# Patient Record
Sex: Female | Born: 1980 | Race: White | Hispanic: No | Marital: Married | Smoking: Never smoker
Health system: Southern US, Community
[De-identification: ages and names within clinical notes are randomized; demographics above are authoritative.]

## PROBLEM LIST (undated history)

## (undated) DIAGNOSIS — L709 Acne, unspecified: Secondary | ICD-10-CM

## (undated) DIAGNOSIS — R112 Nausea with vomiting, unspecified: Secondary | ICD-10-CM

## (undated) DIAGNOSIS — R12 Heartburn: Secondary | ICD-10-CM

## (undated) DIAGNOSIS — F32A Depression, unspecified: Secondary | ICD-10-CM

## (undated) DIAGNOSIS — G4730 Sleep apnea, unspecified: Secondary | ICD-10-CM

## (undated) DIAGNOSIS — I10 Essential (primary) hypertension: Secondary | ICD-10-CM

## (undated) DIAGNOSIS — Z8489 Family history of other specified conditions: Secondary | ICD-10-CM

## (undated) DIAGNOSIS — IMO0001 Reserved for inherently not codable concepts without codable children: Secondary | ICD-10-CM

## (undated) DIAGNOSIS — Z973 Presence of spectacles and contact lenses: Secondary | ICD-10-CM

## (undated) DIAGNOSIS — F419 Anxiety disorder, unspecified: Secondary | ICD-10-CM

## (undated) DIAGNOSIS — L309 Dermatitis, unspecified: Secondary | ICD-10-CM

## (undated) DIAGNOSIS — F329 Major depressive disorder, single episode, unspecified: Secondary | ICD-10-CM

## (undated) DIAGNOSIS — G43909 Migraine, unspecified, not intractable, without status migrainosus: Secondary | ICD-10-CM

## (undated) DIAGNOSIS — J069 Acute upper respiratory infection, unspecified: Secondary | ICD-10-CM

## (undated) DIAGNOSIS — K644 Residual hemorrhoidal skin tags: Secondary | ICD-10-CM

## (undated) DIAGNOSIS — K219 Gastro-esophageal reflux disease without esophagitis: Secondary | ICD-10-CM

## (undated) DIAGNOSIS — M1990 Unspecified osteoarthritis, unspecified site: Secondary | ICD-10-CM

## (undated) DIAGNOSIS — Z9889 Other specified postprocedural states: Secondary | ICD-10-CM

## (undated) HISTORY — DX: Major depressive disorder, single episode, unspecified: F32.9

## (undated) HISTORY — DX: Migraine, unspecified, not intractable, without status migrainosus: G43.909

## (undated) HISTORY — DX: Heartburn: R12

## (undated) HISTORY — DX: Acute upper respiratory infection, unspecified: J06.9

## (undated) HISTORY — DX: Depression, unspecified: F32.A

## (undated) HISTORY — PX: TONSILLECTOMY: SUR1361

## (undated) HISTORY — PX: UPPER GASTROINTESTINAL ENDOSCOPY: SHX188

## (undated) HISTORY — DX: Reserved for inherently not codable concepts without codable children: IMO0001

## (undated) HISTORY — DX: Dermatitis, unspecified: L30.9

## (undated) HISTORY — DX: Anxiety disorder, unspecified: F41.9

## (undated) HISTORY — DX: Gastro-esophageal reflux disease without esophagitis: K21.9

---

## 2001-06-18 ENCOUNTER — Other Ambulatory Visit: Admission: RE | Admit: 2001-06-18 | Discharge: 2001-06-18 | Payer: Self-pay | Admitting: Obstetrics and Gynecology

## 2006-09-10 ENCOUNTER — Other Ambulatory Visit: Admission: RE | Admit: 2006-09-10 | Discharge: 2006-09-10 | Payer: Self-pay | Admitting: Obstetrics and Gynecology

## 2007-09-03 ENCOUNTER — Ambulatory Visit (HOSPITAL_COMMUNITY): Admission: RE | Admit: 2007-09-03 | Discharge: 2007-09-03 | Payer: Self-pay | Admitting: Obstetrics and Gynecology

## 2008-12-24 HISTORY — PX: TONSILLECTOMY: SUR1361

## 2009-01-24 HISTORY — PX: WISDOM TOOTH EXTRACTION: SHX21

## 2009-11-18 ENCOUNTER — Ambulatory Visit (HOSPITAL_COMMUNITY): Admission: RE | Admit: 2009-11-18 | Discharge: 2009-11-18 | Payer: Self-pay | Admitting: Internal Medicine

## 2009-12-05 ENCOUNTER — Ambulatory Visit (HOSPITAL_COMMUNITY): Admission: RE | Admit: 2009-12-05 | Discharge: 2009-12-05 | Payer: Self-pay | Admitting: Family Medicine

## 2011-01-15 ENCOUNTER — Encounter: Payer: Self-pay | Admitting: Obstetrics and Gynecology

## 2011-01-29 ENCOUNTER — Encounter: Payer: Self-pay | Admitting: Orthopedic Surgery

## 2011-02-11 ENCOUNTER — Encounter: Payer: Self-pay | Admitting: Orthopedic Surgery

## 2011-02-13 ENCOUNTER — Encounter: Payer: Self-pay | Admitting: Orthopedic Surgery

## 2011-02-13 ENCOUNTER — Ambulatory Visit (INDEPENDENT_AMBULATORY_CARE_PROVIDER_SITE_OTHER): Payer: BC Managed Care – PPO | Admitting: Orthopedic Surgery

## 2011-02-13 DIAGNOSIS — M654 Radial styloid tenosynovitis [de Quervain]: Secondary | ICD-10-CM

## 2011-02-20 NOTE — Assessment & Plan Note (Signed)
Summary: rt wrist pain/bringing [name] report/needs xrays/[employer]/frs   Vital Signs:  Patient profile:   30 year old female Height:      64 inches Weight:      163 pounds Pulse rate:   80 / minute Resp:     18 per minute  Visit Type:  NEW PATIENT Referring Provider:  SELF Primary Provider:  [name]  CC:  right wrist.  History of Present Illness: I saw Jaime Hughes in the office today for an initial visit.  She is a 30 years old woman with the complaint of:  RIGHT WRIST PAIN.  NO INJURY.  XRAYS TODAY.  MEDS: INDERAL, PROTONIX, ZOLOFT, NUVARING, CIPRO 500MG  two times a day FOR INFECTED LYMPH NODE IN NECK RT SIDE.  [name] FROM 01/29/11 FOR REVIEW.  30 year old female, right-hand-dominant presents with 2 months of pain in her RIGHT wrist associated with aching and popping.  She describes throbbing, burning, pain with swelling, some numbness and tingling. Her pain is rated 7/10. He is to come and go associated with activity. Patient is an Environmental health practitioner.  Previous treatments include Celebrex, x3 weeks. No relief.  Patient had nerve conduction study, which was normal.  Patient has a year history of bilateral numbness in her hands and her arms, but no neck injury. She does complain of some catching in her cervical spine. Apparently, has not been worked up for that.    Allergies (verified): 1)  ! Codeine 2)  ! Sulfa  Past History:  Past Medical History: REFLUX HEARTBURN MIGRAINES DEPRESSION ANXIETY  Past Surgical History: TONSILS  Family History: FH of Cancer:  Family History of Diabetes Family History of Arthritis  Social History: Patient is single.  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 Cardiovascular:  Denies chest pain, palpitations, fainting, and murmurs. Respiratory:   Denies short of breath, wheezing, couch, tightness, pain on inspiration, and snoring . Gastrointestinal:  Complains of heartburn; denies nausea, vomiting, diarrhea, constipation, and blood in your stools. Genitourinary:  Denies frequency, urgency, difficulty urinating, painful urination, flank pain, and bleeding in urine. Neurologic:  Complains of numbness and tingling; denies unsteady gait, dizziness, tremors, and seizure. Musculoskeletal:  Complains of joint pain, swelling, stiffness, and muscle pain; denies instability, redness, and heat. Endocrine:  Denies excessive thirst, exessive urination, and heat or cold intolerance. Psychiatric:  Denies nervousness, depression, anxiety, and hallucinations. Skin:  Denies changes in the skin, poor healing, rash, itching, and redness. HEENT:  Denies blurred or double vision, eye pain, redness, and watering. Immunology:  Complains of seasonal allergies; denies sinus problems and allergic to bee stings. Hemoatologic:  Denies easy bleeding and brusing.  Physical Exam  Additional Exam:  GEN: well developed, well nourished, normal grooming and hygiene, no deformity and normal body habitus.   CDV: pulses are normal, no edema, no erythema. no tenderness  Lymph: normal lymph nodes   Skin: no rashes, skin lesions or open sores   NEURO: normal coordination, reflexes, sensation.   Psyche: awake, alert and oriented. Mood normal   Gait:   Normal.  RIGHT and LEFT upper extremity examination.  Inspection no swelling or joint deformities. There is some slight weakness to grip strength on the RIGHT versus the LEFT. Range of motion otherwise, normal wrist and hand.  Wrist joint is stable.LEFT and RIGHT  Tenderness is noted over the RIGHT 1st extensor compartment and  the radial styloid. 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 This is out to length radial inclination. is normal and the lateral view shows normal tilt of the articular surface  Impression normal wrist  Orders: New Patient Level III (75643) Wrist x-ray complete, minimum 3 views (32951) patient education, [facility] HANDOUT   Patient Instructions: 1)  ASPERCREME APPLY TO THE THUMB three times a day 2)  APPLY ICE 30 MIN IN THE EVENING  3)  WEAR SPLINT X 6 WEEKS  4)  COME BACK IN 6 WEEKS FOR RE EXAMINATION  5)  YOU HAVE DEQUERVAINS SYNDROME / TENDONITIS OF THE THUMB    Orders Added: 1)  New Patient Level III [88416] 2)  Wrist x-ray complete, minimum 3 views [73110]

## 2011-03-06 ENCOUNTER — Encounter: Payer: Self-pay | Admitting: Orthopedic Surgery

## 2011-03-13 ENCOUNTER — Telehealth: Payer: Self-pay | Admitting: Orthopedic Surgery

## 2011-03-13 NOTE — Telephone Encounter (Signed)
Jaime Hughes says she is wearing the brace, using Aspercream, and icing and her wrist is hurting worse and sometimes locks. Her follow-up is not until 04/03/11. She cannot take Advil due to stomach problems.  Is there anything you can prescribe or  Anything  You can suggest she do.

## 2011-03-13 NOTE — Telephone Encounter (Signed)
Tylenol 1000 mg every 8 hours

## 2011-03-22 NOTE — Letter (Signed)
Summary: History form  History form   Imported By: Cammie Sickle 03/12/2011 20:18:07  _____________________________________________________________________  External Attachment:    Type:   Image     Comment:   External Document

## 2011-03-28 ENCOUNTER — Ambulatory Visit: Payer: BC Managed Care – PPO | Admitting: Orthopedic Surgery

## 2011-04-03 ENCOUNTER — Ambulatory Visit (INDEPENDENT_AMBULATORY_CARE_PROVIDER_SITE_OTHER): Payer: BC Managed Care – PPO | Admitting: Orthopedic Surgery

## 2011-04-03 DIAGNOSIS — M654 Radial styloid tenosynovitis [de Quervain]: Secondary | ICD-10-CM

## 2011-04-03 NOTE — Progress Notes (Signed)
30 years old treated for de Quervain's syndrome with bracing, topical Aspercreme and ice. She did not get better with the nonoperative treatment, but did get an injection from her primary care physician and, notes she's 85% better.  Her Inderal has been stopped. She is now on Topamax and she switched from photonics to Nexium.  Exam shows minimal tenderness and minimal symptoms with de Quervain's stress test.  Impression resolving de Quervain's syndrome.  Continued nonoperative treatment, call us if symptoms worsen, to get an injection.

## 2011-04-03 NOTE — Patient Instructions (Addendum)
Start bio-freeze 3 x a day   Continue ice and bracing   If you remain 85% better then no appointment needed   If not then call us for injection if needed

## 2011-09-17 ENCOUNTER — Other Ambulatory Visit (HOSPITAL_COMMUNITY): Payer: Self-pay | Admitting: Family Medicine

## 2011-09-17 ENCOUNTER — Ambulatory Visit (HOSPITAL_COMMUNITY)
Admission: RE | Admit: 2011-09-17 | Discharge: 2011-09-17 | Disposition: A | Discharge status: Home / Self Care | Payer: BC Managed Care – PPO | Source: Ambulatory Visit | Attending: Family Medicine | Admitting: Family Medicine

## 2011-09-17 DIAGNOSIS — R05 Cough: Secondary | ICD-10-CM

## 2011-09-17 DIAGNOSIS — R059 Cough, unspecified: Secondary | ICD-10-CM | POA: Insufficient documentation

## 2011-11-27 ENCOUNTER — Encounter (INDEPENDENT_AMBULATORY_CARE_PROVIDER_SITE_OTHER): Payer: Self-pay | Admitting: Internal Medicine

## 2011-11-27 ENCOUNTER — Ambulatory Visit (INDEPENDENT_AMBULATORY_CARE_PROVIDER_SITE_OTHER): Payer: BC Managed Care – PPO | Admitting: Internal Medicine

## 2011-11-27 VITALS — BP 118/70 | HR 78 | Temp 99.4°F | Resp 12 | Ht 62.0 in | Wt 134.0 lb

## 2011-11-27 DIAGNOSIS — Z8669 Personal history of other diseases of the nervous system and sense organs: Secondary | ICD-10-CM | POA: Insufficient documentation

## 2011-11-27 DIAGNOSIS — K219 Gastro-esophageal reflux disease without esophagitis: Secondary | ICD-10-CM

## 2011-11-27 MED ORDER — OMEPRAZOLE 40 MG PO CPDR: 40.0000 mg | DELAYED_RELEASE_CAPSULE | Freq: Every day | ORAL | Status: DC

## 2011-11-27 NOTE — Patient Instructions (Signed)
Continue omeprazole at 40 mg twice daily for another 3-4 months and then try it once a day; if you start having relapse of breakthrough symptoms can go back to twice a day schedule.

## 2011-12-03 NOTE — Progress Notes (Signed)
Presenting complaint; Followup for chronic GERD. Subjective: Jaime Hughes is 30 year old Caucasian female who was had symptoms of GERD for about 9 years. She had normal EGD in November 2010. She has been doing well with anti-reflex measures and Nexium orally milligrams a day until about 3 months ago when she developed pain in her right ear on swallowing. Dr. Sudie Bailey referred her to ENT specialist. She underwent nasal endoscopy and was felt her symptoms were due to GERD and Nexium dose was increased to twice a day. It works for her co-pay was very high and therefore she was switched to omeprazole twice daily and she is still doing fine. She states he generally sleeps on her right side. She has not experienced any pain on the left side. She has occasional heartburn and burning in her epigastric region. She has lost 30 pounds in the last 7 months felt to be due to Topamax. She denies melena rectal bleeding diarrhea or constipation. In addition to EGD she also had upper abdominal ultrasound in 2010 and it was normal. Current Medications: Current Outpatient Prescriptions  Medication Sig Dispense Refill  . Norethin Ace-Eth Estrad-FE (GILDESS FE 1.5/30 PO) Take by mouth daily.        Marland Kitchen omeprazole (PRILOSEC) 40 MG capsule Take 40 mg by mouth 2 (two) times daily.        . sertraline (ZOLOFT) 100 MG tablet Take 100 mg by mouth daily.        . SUMAtriptan (IMITREX) 25 MG tablet 25 mg. As needed       . topiramate (TOPAMAX) 100 MG tablet Take 100 mg by mouth 2 (two) times daily.        . valACYclovir (VALTREX) 500 MG tablet 500 mg as needed.       Marland Kitchen omeprazole (PRILOSEC) 40 MG capsule Take 1 capsule (40 mg total) by mouth daily.  180 capsule  3   past medical history; Chronic GERD as above. History of migraine and she is doing better with current therapy. Stress disorder/depression. de Quervain,s syndrome involving the right wrist under care of Dr. [name]. Allergies;  codeine and sulfa. Family  history; Father is 74 has CAD DM and hypertension. Mother mother is 52. At 55 she was treated for breast carcinoma and remains in remission. Patient does not have any siblings. Social history; She is single. 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 She has never smoked cigarettes and drinks alcohol occasionally.  Objective: BP 118/70  Pulse 78  Temp(Src) 99.4 F (37.4 C) (Oral)  Resp 12  Ht 5\' 2"  (1.575 m)  Wt 134 lb (60.782 kg)  BMI 24.51 kg/m2  LMP 11/21/2011  Conjunctiva is pink. Sclera is nonicteric Oral pharyngeal mucosa is normal. No neck masses or thyromegaly noted. Cardiac exam with regular rhythm normal S1 and S2. No murmur or gallop noted. Lungs are clear to auscultation. Abdomen is soft and nontender without organomegaly or masses. No LE edema or clubbing noted.  Assessment: Chronic GERD with recent flareup with pharyngeal and aural symptoms. She is doing well with double dose PPI. She is currently on 40 mg of omeprazole twice daily. EGD about 2 years ago was within normal limits.   Plan: Anti-reflux measures reinforced. Consider decreasing omeprazole dose to 40 mg once a day after 3-4 months and if symptoms relapse give [place] a call otherwise office visit in one year.

## 2012-10-07 ENCOUNTER — Other Ambulatory Visit (INDEPENDENT_AMBULATORY_CARE_PROVIDER_SITE_OTHER): Payer: Self-pay | Admitting: Internal Medicine

## 2012-10-07 NOTE — Telephone Encounter (Signed)
Patient will need ov in 3 months.

## 2012-10-08 NOTE — Telephone Encounter (Signed)
Apt has been scheduled for 12/08/12 at 3:30 pm with Dr. Karilyn Cota.

## 2012-10-14 ENCOUNTER — Encounter (INDEPENDENT_AMBULATORY_CARE_PROVIDER_SITE_OTHER): Payer: Self-pay | Admitting: Internal Medicine

## 2012-10-14 ENCOUNTER — Ambulatory Visit (INDEPENDENT_AMBULATORY_CARE_PROVIDER_SITE_OTHER): Payer: BC Managed Care – PPO | Admitting: Internal Medicine

## 2012-10-14 VITALS — BP 108/70 | HR 74 | Temp 98.0°F | Resp 16 | Ht 62.0 in | Wt 140.3 lb

## 2012-10-14 DIAGNOSIS — K219 Gastro-esophageal reflux disease without esophagitis: Secondary | ICD-10-CM

## 2012-10-14 DIAGNOSIS — R1011 Right upper quadrant pain: Secondary | ICD-10-CM | POA: Insufficient documentation

## 2012-10-14 MED ORDER — OMEPRAZOLE-SODIUM BICARBONATE 40-1100 MG PO CAPS: 1.0000 | ORAL_CAPSULE | Freq: Every day | ORAL | Status: DC

## 2012-10-14 NOTE — Patient Instructions (Addendum)
Physician will contact you with results of HIDA scan when completed. Discontinue omeprazole. Start omeprazole/sodium bicarbonate by mouth before breakfast and bedtime daily.

## 2012-10-14 NOTE — Progress Notes (Signed)
Presenting complaint;  Follow for chronic GERD. Right upper quadrant abdominal pain. Subjective:  Patient is 31 year old Caucasian female who has chronic GERD and was last seen in December 2012. She now presents with 2 complaints. She stays omeprazole is not working. She is having heartburn and regurgitation and she wakes with rawness in her chest and back taste. She got best relief with Nexium her co-pay was too high. She also has tried protonix but doesn't remember if it works well. She denies dysphagia hoarseness or chronic cough. She is watching her diet closely. She also complains of right upper quadrant pain radiating to was her back and shoulder and described to be sharp pain associated with bloating. She has had this pain off and on for the last 6 weeks. This pain is worse after meals. Her bowels generally move daily she may have occasional constipation. She has gained 6 pounds since her last visit. She also reports having 3 episodes of vomiting and diarrhea all operating on Mondays and she recalls she had late dinner each time. She does not take OTC NSAIDs. Current Medications: Current Outpatient Prescriptions  Medication Sig Dispense Refill  . Norethin Ace-Eth Estrad-FE (GILDESS FE 1.5/30 PO) Take by mouth daily.        Marland Kitchen omeprazole (PRILOSEC) 40 MG capsule Take 40 mg by mouth 2 (two) times daily.        . sertraline (ZOLOFT) 100 MG tablet Take 100 mg by mouth daily.        . SUMAtriptan (IMITREX) 25 MG tablet 25 mg. As needed       . topiramate (TOPAMAX) 100 MG tablet Take 100 mg by mouth 2 (two) times daily.        . valACYclovir (VALTREX) 500 MG tablet 500 mg as needed.       Marland Kitchen DISCONTD: omeprazole (PRILOSEC) 40 MG capsule TAKE 1 CAPSULE TWICE DAILY  180 capsule  0  . DISCONTD: omeprazole (PRILOSEC) 40 MG capsule Take 1 capsule (40 mg total) by mouth daily.  180 capsule  3     Objective: Blood pressure 108/70, pulse 74, temperature 98 F (36.7 C), temperature source Oral,  resp. rate 16, height 5\' 2"  (1.575 m), weight 140 lb 4.8 oz (63.64 kg), last menstrual period 09/17/2012.  patient is alert and in no acute history Conjunctiva is pink. Sclera is nonicteric Oropharyngeal mucosa is normal. No neck masses or thyromegaly noted. Cardiac exam with regular rhythm normal S1 and S2. No murmur or gallop noted. Lungs are clear to auscultation. Abdomen is symmetrical. Bowel sounds are normal. Abdomen is soft with mild tenderness below the right costal margin. No hepatosplenomegaly or masses. No LE edema or clubbing noted.  Labs/studies Results:  She had normal ultrasound in November 2010.  Assessment:  #1. Refractory GERD. She is not responding to 80 mg of omeprazole daily. It is possible that she has been retracted disease resulting in poor control of her symptoms. #2. Right upper quadrant abdominal pain with sporadic vomiting. Need to rule out biliary tract disease. Ultrasound 3 years ago was negative for cholelithiasis.   Plan:  Discontinue omeprazole. Zegrid 40 mg by mouth twice a day. HIDA scan with [facility]. Office visit in 6 months.

## 2012-10-20 ENCOUNTER — Encounter (HOSPITAL_COMMUNITY): Payer: Self-pay

## 2012-10-20 ENCOUNTER — Encounter (HOSPITAL_COMMUNITY)
Admission: RE | Admit: 2012-10-20 | Discharge: 2012-10-20 | Disposition: A | Discharge status: Home / Self Care | Payer: BC Managed Care – PPO | Source: Ambulatory Visit | Attending: Internal Medicine | Admitting: Internal Medicine

## 2012-10-20 DIAGNOSIS — R1011 Right upper quadrant pain: Secondary | ICD-10-CM | POA: Insufficient documentation

## 2012-10-20 MED ORDER — TECHNETIUM TC 99M MEBROFENIN IV KIT
5.0000 | PACK | Freq: Once | INTRAVENOUS | Status: AC | PRN
  Administered 2012-10-20: 4.9 via INTRAVENOUS

## 2012-12-08 ENCOUNTER — Ambulatory Visit (INDEPENDENT_AMBULATORY_CARE_PROVIDER_SITE_OTHER): Payer: BC Managed Care – PPO | Admitting: Internal Medicine

## 2013-03-13 ENCOUNTER — Telehealth (INDEPENDENT_AMBULATORY_CARE_PROVIDER_SITE_OTHER): Payer: Self-pay | Admitting: *Deleted

## 2013-03-13 NOTE — Telephone Encounter (Signed)
Jaime Hughes called and said her Omeprazole-Sodium Bicarbonate was going to cost her $600. The deductible has started back over for her medications. She was like to see if there is something cheaper and as strong to help. She can be reached at [phone].

## 2013-03-13 NOTE — Telephone Encounter (Signed)
Patient called and made aware that she may pick up samples ,until Dr.Rehman could review . She will pick up on Monday

## 2013-03-26 ENCOUNTER — Encounter (INDEPENDENT_AMBULATORY_CARE_PROVIDER_SITE_OTHER): Payer: Self-pay | Admitting: *Deleted

## 2013-04-01 ENCOUNTER — Telehealth (INDEPENDENT_AMBULATORY_CARE_PROVIDER_SITE_OTHER): Payer: Self-pay | Admitting: *Deleted

## 2013-04-01 NOTE — Telephone Encounter (Signed)
Jaime Hughes has not heard anything back from phone message left on 03/13/13. She only has about 1 week left of the medicine. Rescheduled her apt on 04/14/13 with Jaime Ar, NP to 06/01/13 to see Jaime Hughes. Patient refused to see Jaime Hughes. Jaime Hughes said she would be okay with samples until her apt if need be. Her return phone number during 8 to 5 is [phone].  Below is a copy of previous message: Jaime Hughes called and said her Omeprazole-Sodium Bicarbonate was going to cost her $600. The deductible has started back over for her medications. She was like to see if there is something cheaper and as strong to help. She can be reached at [phone]. From Jaime Hughes: Patient called and made aware that she may pick up samples ,until JaimeRehman could review . She will pick up on Monday

## 2013-04-01 NOTE — Telephone Encounter (Signed)
Patient called and made aware that I will follow up with Dr.Rehman about a PPI that may be affordable. Patient states that she has tried Nexium , Protonix, and Omeprazole. All of these worked for a while but then she had  Break through symtoms. Patient may be reached at [phone]

## 2013-04-14 ENCOUNTER — Ambulatory Visit (INDEPENDENT_AMBULATORY_CARE_PROVIDER_SITE_OTHER): Payer: BC Managed Care – PPO | Admitting: Internal Medicine

## 2013-06-01 ENCOUNTER — Ambulatory Visit (INDEPENDENT_AMBULATORY_CARE_PROVIDER_SITE_OTHER): Payer: BC Managed Care – PPO | Admitting: Internal Medicine

## 2013-06-01 ENCOUNTER — Encounter (INDEPENDENT_AMBULATORY_CARE_PROVIDER_SITE_OTHER): Payer: Self-pay | Admitting: Internal Medicine

## 2013-06-01 VITALS — BP 118/76 | HR 74 | Temp 98.5°F | Resp 18 | Ht 62.0 in | Wt 150.5 lb

## 2013-06-01 DIAGNOSIS — K219 Gastro-esophageal reflux disease without esophagitis: Secondary | ICD-10-CM

## 2013-06-01 MED ORDER — PANTOPRAZOLE SODIUM 40 MG PO TBEC
40.0000 mg | DELAYED_RELEASE_TABLET | Freq: Two times a day (BID) | ORAL | Status: DC
Start: 2013-06-01 — End: 2014-05-04

## 2013-06-01 NOTE — Progress Notes (Signed)
Presenting complaint;  Followup for chronic GERD. Medication issues.  Subjective:  Patient is 32 year old Caucasian female who has chronic GERD and was last seen in October 2013 who is here for scheduled visit. She is on Zegerid and has had good results her co-pay is too high. Previously she was in Nexium which worked well but she could not for the medication. She has taken pantoprazole in the past which worked initially and then stopped working. She is willing to try this medication since she can't afford it. She rarely has heartburn while in therapy unless she eats something that she is not supposed. She denies nocturnal regurgitation dysphagia hoarseness or sore throat. Lately she has had cough which she believes is due to allergies. She states her diet has improved a great deal since she has been cooking at home for the last one year. She is also not drinking colas. She has gained 10 pounds since her last visit and now she's trying to lose it.  Current Medications: Current Outpatient Prescriptions  Medication Sig Dispense Refill  . Norethin Ace-Eth Estrad-FE (GILDESS FE 1.5/30 PO) Take by mouth daily.        Marland Kitchen omeprazole-sodium bicarbonate (ZEGERID) 40-1100 MG per capsule Take 1 capsule by mouth daily before breakfast.  60 capsule  5  . sertraline (ZOLOFT) 100 MG tablet Take 100 mg by mouth daily.        . SUMAtriptan (IMITREX) 25 MG tablet 25 mg. As needed       . topiramate (TOPAMAX) 100 MG tablet Take 100 mg by mouth 2 (two) times daily.        . valACYclovir (VALTREX) 500 MG tablet 500 mg as needed.        No current facility-administered medications for this visit.     Objective: Blood pressure 118/76, pulse 74, temperature 98.5 F (36.9 C), temperature source Oral, resp. rate 18, height 5\' 2"  (1.575 m), weight 150 lb 8 oz (68.266 kg), last menstrual period 04/28/2013. Patient is alert and in no acute distress. Conjunctiva is pink. Sclera is nonicteric Oropharyngeal mucosa is  normal. No neck masses or thyromegaly noted. Cardiac exam with regular rhythm normal S1 and S2. No murmur or gallop noted. Lungs are clear to auscultation. Abdomen is full but soft and nontender without organomegaly or masses. No LE edema or clubbing noted.   Assessment:  #1. Chronic GERD. She has had symptoms for more than 7 years. She had normal EGD at [facility] in November 2010.  she needs to pursue lifestyle modifications more aggressively so that she could be maintained on single dose of PPI so that long-term effects of PPI therapy can be minimized.   Plan: Anti-reflex measures reinforced. Discontinue Zegrid. Pantoprazole 40 mg by mouth twice a day. Prescription given for 90 days with 3 refills. Office visit in one year unless pantoprazole proves to be ineffective.

## 2013-06-01 NOTE — Patient Instructions (Signed)
Continue anti-reflux measures. Discontinue Zegerid. Begin pantoprazole 40 mg by mouth 30 minutes before breakfast and evening meal daily. Notify if pantoprazole does not work

## 2013-12-28 ENCOUNTER — Telehealth (INDEPENDENT_AMBULATORY_CARE_PROVIDER_SITE_OTHER): Payer: Self-pay | Admitting: *Deleted

## 2013-12-28 NOTE — Telephone Encounter (Signed)
Patient states that she for the last 2 weeks has been experiencing the worst burning , and gurgling. The Zegerid worked really well but insurance would not cover it. She wonders if there is something else she can try or if she should be brought in to discuss other options. Patient insurance is the same,currently out of samples. Appointment is currently for June /followup.

## 2013-12-28 NOTE — Telephone Encounter (Signed)
Patient was called and made aware of Dr.Rehman's recommendation. She will check on this tonight, and call our office tomorrow. If this is something she can do , she will need a prescription so that she may us her insurance savings card.

## 2013-12-28 NOTE — Telephone Encounter (Signed)
If she can afford OTC Zegrid she can take it twice daily. Otherwise will try lansoprazole 30 mg by mouth twice a day. Please call prescription for 60 with 5 refills.

## 2014-01-12 ENCOUNTER — Ambulatory Visit (INDEPENDENT_AMBULATORY_CARE_PROVIDER_SITE_OTHER): Payer: BC Managed Care – PPO | Admitting: Surgery

## 2014-01-22 ENCOUNTER — Encounter (INDEPENDENT_AMBULATORY_CARE_PROVIDER_SITE_OTHER): Payer: Self-pay

## 2014-01-22 ENCOUNTER — Telehealth (INDEPENDENT_AMBULATORY_CARE_PROVIDER_SITE_OTHER): Payer: Self-pay | Admitting: Surgery

## 2014-01-22 ENCOUNTER — Ambulatory Visit (INDEPENDENT_AMBULATORY_CARE_PROVIDER_SITE_OTHER): Payer: BC Managed Care – PPO | Admitting: Surgery

## 2014-01-22 ENCOUNTER — Encounter (INDEPENDENT_AMBULATORY_CARE_PROVIDER_SITE_OTHER): Payer: Self-pay | Admitting: Surgery

## 2014-01-22 VITALS — BP 120/64 | HR 68 | Temp 98.0°F | Resp 18 | Ht 62.0 in | Wt 160.0 lb

## 2014-01-22 DIAGNOSIS — K644 Residual hemorrhoidal skin tags: Secondary | ICD-10-CM

## 2014-01-22 DIAGNOSIS — K648 Other hemorrhoids: Secondary | ICD-10-CM

## 2014-01-22 NOTE — Telephone Encounter (Signed)
Patient met with surgery schedulers gave financial responsibilities, patient will call back to schedule

## 2014-01-22 NOTE — Patient Instructions (Signed)
Please consider the recommendations that we have given you today:  Consider surgery to remove the outside hemorrhoids and tied down the hemorrhoids on the inside.  It would be an outpatient surgery.    Take a fiber supplement such as Metamucil every day the rest of her life to keep your bowels soft and minimize the chance that new hemorrhoids will grow  See the Handout(s) we have given you.  Please call our office at [phone] if you wish to schedule surgery or if you have further questions / concerns.   HEMORRHOIDS  The rectum is the last foot of your colon, and it naturally stretches to hold stool.  Hemorrhoidal piles are natural clusters of blood vessels that help the rectum and anal canal stretch to hold stool and allow bowel movements to eliminate feces.   Hemorrhoids are abnormally swollen blood vessels in the rectum.  Too much pressure in the rectum causes hemorrhoids by forcing blood to stretch and bulge the walls of the veins, sometimes even rupturing them.  Hemorrhoids can become like varicose veins you might see on a person's legs.  Most people will develop a flare of hemorrhoids in their lifetime.  When bulging hemorrhoidal veins are irritated, they can swell, burn, itch, cause pain, and bleed.  Most flares will calm down gradually own within a few weeks.  However, once hemorrhoids are created, they are difficult to get rid of completely and tend to flare more easily than the first flare.   Fortunately, good habits and simple medical treatment usually control hemorrhoids well, and surgery is needed only in severe cases. Types of Hemorrhoids:  Internal hemorrhoids usually don't initially hurt or itch; they are deep inside the rectum and usually have no sensation. If they begin to push out (prolapse), pain and burning can occur.  However, internal hemorrhoids can bleed.  Anal bleeding should not be ignored since bleeding could come from a dangerous source like colorectal cancer, so  persistent rectal bleeding should be investigated by a doctor, sometimes with a colonoscopy.  External hemorrhoids cause most of the symptoms - pain, burning, and itching. Nonirritated hemorrhoids can look like small skin tags coming out of the anus.   Thrombosed hemorrhoids can form when a hemorrhoid blood vessel bursts and causes the hemorrhoid to suddenly swell.  A purple blood clot can form in it and become an excruciatingly painful lump at the anus. Because of these unpleasant symptoms, immediate incision and drainage by a surgeon at an office visit can provide much relief of the pain.    PREVENTION Avoiding the most frequent causes listed below will prevent most cases of hemorrhoids: Constipation Hard stools Diarrhea  Constant sitting  Straining with bowel movements Sitting on the toilet for a long time  Severe coughing  episodes Pregnancy / Childbirth  Heavy Lifting  Sometimes avoiding the above triggers is difficult:  How can you avoid sitting all day if you have a seated job? Also, we try to avoid coughing and diarrhea, but sometimes it's beyond your control.  Still, there are some practical hints to help: Keep the anal and genital area clean.  Moistened tissues such as flushable wet wipes are less irritating than toilet paper.  Using irrigating showers or bottle irrigation washing gently cleans this sensitive area.   Avoid dry toilet paper when cleaning after bowel movements.  Marland Kitchen Keep the anal and genital area dry.  Lightly pat the rectal area dry.  Avoid rubbing.  Talcum or baby powders can help  GET YOUR STOOLS SOFT.   This is the most important way to prevent irritated hemorrhoids.  Hard stools are like sandpaper to the anorectal canal and will cause more problems.  The goal: ONE SOFT BOWEL MOVEMENT A DAY!  BMs from every other day to 3 times a day is a tolerable range Treat coughing, diarrhea and constipation early since irritated hemorrhoids may soon follow.  If your main job  activity is seated, always stand or walk during your breaks. Make it a point to stand and walk at least 5 minutes every hour and try to shift frequently in your chair to avoid direct rectal pressure.  Always exhale as you strain or lift. Don't hold your breath.  Do not delay or try to prevent a bowel movement when the urge is present. Exercise regularly (walking or jogging 60 minutes a day) to stimulate the bowels to move. No reading or other activity while on the toilet. If bowel movements take longer than 5 minutes, you are too constipated. AVOID CONSTIPATION Drink plenty of liquids (1 1/2 to 2 quarts of water and other fluids a day unless fluid restricted for another medical condition). Liquids that contain caffeine (coffee a, tea, soft drinks) can be dehydrating and should be avoided until constipation is controlled. Consider minimizing milk, as dairy products may be constipating. Eat plenty of fiber (30g a day ideal, more if needed).  Fiber is the undigested part of plant food that passes into the colon, acting as "natures broom" to encourage bowel motility and movement.  Fiber can absorb and hold large amounts of water. This results in a larger, bulkier stool, which is soft and easier to pass.  Eating foods high in fiber - 12 servings - such as  Vegetables: Root (potatoes, carrots, turnips), Leafy green (lettuce, salad greens, celery, spinach), High residue (cabbage, broccoli, etc.) Fruit: Fresh, Dried (prunes, apricots, cherries), Stewed (applesauce)  Whole grain breads, pasta, whole wheat Bran cereals, muffins, etc. Consider adding supplemental bulking fiber which retains large volumes of water: Psyllium ground seeds (native plant from [place])--available as Metamucil, Konsyl, Effersyllium, Per Diem Fiber, or the less expensive generic forms.  Citrucel  (methylcellulose wood fiber) . FiberCon (Polycarbophil) Polyethylene Glycol - and "artificial" fiber commonly called Miralax or  Glycolax.  It is helpful for people with gassy or bloated feelings with regular fiber Flax Seed - a less gassy natural fiber  Laxatives can be useful for a short period if constipation is severe Osmotics (Milk of Magnesia, Fleets Phospho-Soda, Magnesium Citrate)  Stimulants (Senokot,   Castor Oil,  Dulcolax, Ex-Lax)    Laxatives are not a good long-term solution as it can stress the bowels and cause too much mineral loss and dehydration.   Avoid taking laxatives for more than 7 days in a row.  AVOID DIARRHEA Switch to liquids and simpler foods for a few days to avoid stressing your intestines further. Avoid dairy products (especially milk & ice cream) for a short time.  The intestines often can lose the ability to digest lactose when stressed. Avoid foods that cause gassiness or bloating.  Typical foods include beans and other legumes, cabbage, broccoli, and dairy foods.  Every person has some sensitivity to other foods, so listen to your body and avoid those foods that trigger problems for you. Adding fiber (Citrucel, Metamucil, FiberCon, Flax seed, Miralax) gradually can help thicken stools by absorbing excess fluid and retrain the intestines to act more normally.  Slowly increase the dose over a few weeks.  Too much fiber too soon can backfire and cause cramping & bloating. Probiotics (such as active yogurt, Align, etc) may help repopulate the intestines and colon with normal bacteria and calm down a sensitive digestive tract.  Most studies show it to be of mild help, though, and such products can be costly. Medicines: Bismuth subsalicylate (ex. Kayopectate, Pepto Bismol) every 30 minutes for up to 6 doses can help control diarrhea.  Avoid if pregnant. Loperamide (Immodium) can slow down diarrhea.  Start with two tablets (4mg  total) first and then try one tablet every 6 hours.  Avoid if you are having fevers or severe pain.  If you are not better or start feeling worse, stop all medicines and call  your doctor for advice Call your doctor if you are getting worse or not better.  Sometimes further testing (cultures, endoscopy, X-ray studies, bloodwork, etc) may be needed to help diagnose and treat the cause of the diarrhea. TREATMENT OF HEMORRHOID FLARE If these preventive measures fail, you must take action right away! Hemorrhoids are one condition that can be mild in the morning and become intolerable by nightfall. Most hemorrhoidal flares take several weeks to calm down.  These suggestions can help: Warm soaks.  This helps more than any topical medication.  Use up to 8 times a day.  Usually sitz baths or sitting in a warm bathtub helps.  Sitting on moist warm towels are helpful.  Switching to ice packs/cool compresses can be helpful  Use a Sitz Bath 4-8 times a day for relief A sitz bath is a warm water bath taken in the sitting position that covers only the hips and buttocks. It may be used for either healing or hygiene purposes. Sitz baths are also used to relieve pain, itching, or muscle spasms. The water may contain medicine. Moist heat will help you heal and relax.  HOME CARE INSTRUCTIONS  Take 3 to 4 sitz baths a day. 1. Fill the bathtub half full with warm water. 2. Sit in the water and open the drain a little. 3. Turn on the warm water to keep the tub half full. Keep the water running constantly. 4. Soak in the water for 15 to 20 minutes. 5. After the sitz bath, pat the affected area dry first. SEEK MEDICAL CARE IF:  You get worse instead of better. Stop the sitz baths if you get worse.  Normalize your bowels.  Extremes of diarrhea or constipation will make hemorrhoids worse.  One soft bowel movement a day is the goal.  Fiber can help get your bowels regular Wet wipes instead of toilet paper Pain control with a NSAID such as ibuprofen (Advil) or naproxen (Aleve) or acetaminophen (Tylenol) around the clock.  Narcotics are constipating and should be minimized if possible Topical  creams contain steroids (bydrocortisone) or local anesthetic (xylocaine) can help make pain and itching more tolerable.   EVALUATION If hemorrhoids are still causing problems, you could benefit by an evaluation by a surgeon.  The surgeon will obtain a history and examine you.  If hemorrhoids are diagnosed, some therapies can be offered in the office, usually with an anoscope into the less sensitive area of the rectum: -injection of hemorrhoids (sclerotherapy) can scar the blood vessels of the swollen/enlarged hemorrhoids to help shrink them down to a more normal size -rubber banding of the enlarged hemorrhoids to help shrink them down to a more normal size -drainage of the blood clot causing a thrombosed hemorrhoid,  to relieve the severe pain  While 90% of the time such problems from hemorrhoids can be managed without preceding to surgery, sometimes the hemorrhoids require a operation to control the problem (uncontrolled bleeding, prolapse, pain, etc.).   This involves being placed under general anesthesia where the surgeon can confirm the diagnosis and remove, suture, or staple the hemorrhoid(s).  Your surgeon can help you treat the problem appropriately.    ANORECTAL SURGERY: POST OP INSTRUCTIONS  1. Take your usually prescribed home medications unless otherwise directed. 2. DIET: Follow a light bland diet the first 24 hours after arrival home, such as soup, liquids, crackers, etc.  Be sure to include lots of fluids daily.  Avoid fast food or heavy meals as your are more likely to get nauseated.  Eat a low fat the next few days after surgery.   3. PAIN CONTROL: a. Pain is best controlled by a usual combination of three different methods TOGETHER: i. Ice/Heat ii. Over the counter pain medication iii. Prescription pain medication b. Most patients will experience some swelling and discomfort in the anus/rectal area. and incisions.  Ice packs or heat (30-60 minutes up to 6 times a day) will help.  Use ice for the first few days to help decrease swelling and bruising, then switch to heat such as warm towels, sitz baths, warm baths, etc to help relax tight/sore spots and speed recovery.  Some people prefer to use ice alone, heat alone, alternating between ice & heat.  Experiment to what works for you.  Swelling and bruising can take several weeks to resolve.   c. It is helpful to take an over-the-counter pain medication regularly for the first few weeks.  Choose one of the following that works best for you: i. Naproxen (Aleve, etc)  Two 220mg  tabs twice a day ii. Ibuprofen (Advil, etc) Three 200mg  tabs four times a day (every meal & bedtime) iii. Acetaminophen (Tylenol, etc) 500-650mg  four times a day (every meal & bedtime) d. A  prescription for pain medication (such as oxycodone, hydrocodone, etc) should be given to you upon discharge.  Take your pain medication as prescribed.  i. If you are having problems/concerns with the prescription medicine (does not control pain, nausea, vomiting, rash, itching, etc), please call us [phone] to see if we need to switch you to a different pain medicine that will work better for you and/or control your side effect better. ii. If you need a refill on your pain medication, please contact your pharmacy.  They will contact our office to request authorization. Prescriptions will not be filled after 5 pm or on week-ends.  Use a Sitz Bath 4-8 times a day for relief A sitz bath is a warm water bath taken in the sitting position that covers only the hips and buttocks. It may be used for either healing or hygiene purposes. Sitz baths are also used to relieve pain, itching, or muscle spasms. The water may contain medicine. Moist heat will help you heal and relax.  HOME CARE INSTRUCTIONS  Take 3 to 4 sitz baths a day. 6. Fill the bathtub half full with warm water. 7. Sit in the water and open the drain a little. 8. Turn on the warm water to keep the tub half  full. Keep the water running constantly. 9. Soak in the water for 15 to 20 minutes. 10. After the sitz bath, pat the affected area dry first. SEEK MEDICAL CARE IF:  You get worse instead of better. Stop the sitz baths if you get  worse.   4. KEEP YOUR BOWELS REGULAR a. The goal is one bowel movement a day b. Avoid getting constipated.  Between the surgery and the pain medications, it is common to experience some constipation.  Increasing fluid intake and taking a fiber supplement (such as Metamucil, Citrucel, FiberCon, MiraLax, etc) 1-2 times a day regularly will usually help prevent this problem from occurring.  A mild laxative (prune juice, Milk of Magnesia, MiraLax, etc) should be taken according to package directions if there are no bowel movements after 48 hours. c. Watch out for diarrhea.  If you have many loose bowel movements, simplify your diet to bland foods & liquids for a few days.  Stop any stool softeners and decrease your fiber supplement.  Switching to mild anti-diarrheal medications (Kayopectate, Pepto Bismol) can help.  If this worsens or does not improve, please call us.  5. Wound Care a. Remove your bandages the day after surgery.  Unless discharge instructions indicate otherwise, leave your bandage dry and in place overnight.  Remove the bandage during your first bowel movement.   b. Allow the wound packing to fall out over the next few days.  You can trim exposed gauze / ribbon as it falls out.  You do not need to repack the wound unless instructed otherwise.  Wear an absorbent pad or soft cotton gauze in your underwear as needed to catch any drainage and help keep the area  c. Keep the area clean and dry.  Bathe / shower every day.  Keep the area clean by showering / bathing over the incision / wound.   It is okay to soak an open wound to help wash it.  Wet wipes or showers / gentle washing after bowel movements is often less traumatic than regular toilet paper. d. Bonita Quin may have  some styrofoam-like soft packing in the rectum which will come out with the first bowel movement.  e. You will often notice bleeding with bowel movements.  This should slow down by the end of the first week of surgery f. Expect some drainage.  This should slow down, too, by the end of the first week of surgery.  Wear an absorbent pad or soft cotton gauze in your underwear until the drainage stops. 6. ACTIVITIES as tolerated:   a. You may resume regular (light) daily activities beginning the next day-such as daily self-care, walking, climbing stairs-gradually increasing activities as tolerated.  If you can walk 30 minutes without difficulty, it is safe to try more intense activity such as jogging, treadmill, bicycling, low-impact aerobics, swimming, etc. b. Save the most intensive and strenuous activity for last such as sit-ups, heavy lifting, contact sports, etc  Refrain from any heavy lifting or straining until you are off narcotics for pain control.   c. DO NOT PUSH THROUGH PAIN.  Let pain be your guide: If it hurts to do something, don't do it.  Pain is your body warning you to avoid that activity for another week until the pain goes down. d. You may drive when you are no longer taking prescription pain medication, you can comfortably sit for long periods of time, and you can safely maneuver your car and apply brakes. e. Bonita Quin may have sexual intercourse when it is comfortable.  7. FOLLOW UP in our office a. Please call [facility] at [phone] to set up an appointment to see your surgeon in the office for a follow-up appointment approximately 2 weeks after your surgery. b. Make sure that you  call for this appointment the day you arrive home to insure a convenient appointment time. 10. IF YOU HAVE DISABILITY OR FAMILY LEAVE FORMS, BRING THEM TO THE OFFICE FOR PROCESSING.  DO NOT GIVE THEM TO YOUR DOCTOR.        WHEN TO CALL us [phone]: 1. Poor pain control 2. Reactions / problems with new  medications (rash/itching, nausea, etc)  3. Fever over 101.5 F (38.5 C) 4. Inability to urinate 5. Nausea and/or vomiting 6. Worsening swelling or bruising 7. Continued bleeding from incision. 8. Increased pain, redness, or drainage from the incision  The clinic staff is available to answer your questions during regular business hours (8:30am-5pm).  Please don't hesitate to call and ask to speak to one of our nurses for clinical concerns.   A surgeon from [facility] is always on call at the hospitals   If you have a medical emergency, go to the nearest emergency room or call 911.    [facility] [address], [place], [place]  [address] ? MAIN: [phone] ? TOLL FREE: [phone] ? FAX [phone] [email or web address]  Managing Pain  Pain after surgery or related to activity is often due to strain/injury to muscle, tendon, nerves and/or incisions.  This pain is usually short-term and will improve in a few months.   Many people find it helpful to do the following things TOGETHER to help speed the process of healing and to get back to regular activity more quickly:  1. Avoid heavy physical activity a.  no lifting greater than 20 pounds b. Do not "push through" the pain.  Listen to your body and avoid positions and maneuvers than reproduce the pain c. Walking is okay as tolerated, but go slowly and stop when getting sore.  d. Remember: If it hurts to do it, then don't do it! 2. Take Anti-inflammatory medication  a. Take with food/snack around the clock for 1-2 weeks i. This helps the muscle and nerve tissues become less irritable and calm down faster b. Choose ONE of the following over-the-counter medications: i. Naproxen 220mg  tabs (ex. Aleve) 1-2 pills twice a day  ii. Ibuprofen 200mg  tabs (ex. Advil, Motrin) 3-4 pills with every meal and just before bedtime iii. Acetaminophen 500mg  tabs (Tylenol) 1-2 pills with  every meal and just before bedtime 3. Use a Heating pad or Ice/Cold Pack a. 4-6 times a day b. May use warm bath/hottub  or showers 4. Try Gentle Massage and/or Stretching  a. at the area of pain many times a day b. stop if you feel pain - do not overdo it  Try these steps together to help you body heal faster and avoid making things get worse.  Doing just one of these things may not be enough.    If you are not getting better after two weeks or are noticing you are getting worse, contact our office for further advice; we may need to re-evaluate you & see what other things we can do to help.

## 2014-01-22 NOTE — Progress Notes (Signed)
Subjective:     Patient ID: Jaime Hughes, female   DOB: 1981/12/14, 33 y.o.   MRN: [id number]  HPI  Note: This dictation was prepared with Dragon/digital dictation along with [facility] technology. Any transcriptional errors that result from this process are unintentional.       DELANEY PERONA  11-02-1981 [id number]  Patient Care Team: Milana Obey, MD as PCP - General (Family Medicine) Sherron Monday, MD as Consulting Physician (Obstetrics and Gynecology) Malissa Hippo, MD as Consulting Physician (Gastroenterology)  This patient is a 33 y.o.female who presents today for surgical evaluation at the request of Dr. Ellyn Hack.   Reason for visit: Hemorrhoids  Pleasant woman with hemorrhoid issues for many years.  Intermittent flares.  Has used over-the-counter creams and occasional suppositories.  They partially L.  Recently, it has become more painful.  Some bleeding.  She brought it up to her gynecologist.  Surgical consultation recommended.  She usually has a bowel movement once a day.  Rare episodes of constipation.  Never had a colonoscopy.  Has had some reflux issues followed by Dr. Karilyn Cota gastroenterology controlled with Protonix PPI.  No personal nor family history of GI/colon cancer, inflammatory bowel disease, irritable bowel syndrome, allergy such as Celiac Sprue, dietary/dairy problems, colitis, ulcers nor gastritis.  No recent sick contacts/gastroenteritis.  No travel outside the country.  No changes in diet.  No dysphagia to solids or liquids.  No significant heartburn or reflux.  No hematochezia, hematemesis, coffee ground emesis.  No evidence of prior gastric/peptic ulceration.    Patient Active Problem List   Diagnosis Date Noted  . Abdominal pain, right upper quadrant 10/14/2012  . GERD (gastroesophageal reflux disease) 11/27/2011  . History of migraine headaches 11/27/2011  . 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 Reflux   .  Heartburn   . Migraines   . Depression   . Anxiety     Past Surgical History  Procedure Laterality Date  . Tonsillectomy    . Upper gastrointestinal endoscopy    . Wisdom tooth extraction  01/2009    x2    History   Social History  . Marital Status: Married    Spouse Name: N/A    Number of Children: N/A  . Years of Education: N/A   Occupational History  . Chief Technology Officer     Social History Main Topics  . Smoking status: Never Smoker   . Smokeless tobacco: Never Used  . Alcohol Use: No  . Drug Use: Not on file  . Sexual Activity: Not on file   Other Topics Concern  . Not on file   Social History Narrative  . No narrative on file    Family History  Problem Relation Age of Onset  . Cancer      family history   . Diabetes      family history   . Arthritis      family history   . Breast cancer Mother   . Hypertension Father   . Diabetes Maternal Uncle   . Stroke Maternal Uncle     Current Outpatient Prescriptions  Medication Sig Dispense Refill  . Norethin Ace-Eth Estrad-FE (GILDESS FE 1.5/30 PO) Take by mouth Hughes.        Marland Kitchen omeprazole-sodium bicarbonate (ZEGERID) 40-1100 MG per capsule Take 1 capsule by mouth Hughes before breakfast.      . sertraline (ZOLOFT) 100 MG tablet Take 100 mg by mouth Hughes.        Marland Kitchen  SUMAtriptan (IMITREX) 25 MG tablet 25 mg. As needed       . topiramate (TOPAMAX) 100 MG tablet Take 100 mg by mouth 2 (two) times Hughes.        . valACYclovir (VALTREX) 500 MG tablet 500 mg as needed.       . pantoprazole (PROTONIX) 40 MG tablet Take 1 tablet (40 mg total) by mouth 2 (two) times Hughes before a meal.  180 tablet  3   No current facility-administered medications for this visit.     Allergies  Allergen Reactions  . Codeine   . Sulfonamide Derivatives     BP 120/64  Pulse 68  Temp(Src) 98 F (36.7 C)  Resp 18  Ht 5\' 2"  (1.575 m)  Wt 160 lb (72.576 kg)  BMI 29.26 kg/m2  No results found.  Review of  Systems  Constitutional: Negative for fever, chills, diaphoresis, appetite change and fatigue.  HENT: Negative for ear discharge, ear pain, sore throat and trouble swallowing.   Eyes: Negative for photophobia, discharge and visual disturbance.  Respiratory: Negative for cough, choking, chest tightness and shortness of breath.   Cardiovascular: Negative for chest pain and palpitations.  Gastrointestinal: Positive for anal bleeding and rectal pain. Negative for nausea, vomiting, abdominal pain, diarrhea, constipation and blood in stool.  Endocrine: Negative for cold intolerance and heat intolerance.  Genitourinary: Negative for dysuria, frequency and difficulty urinating.  Musculoskeletal: Negative for gait problem, myalgias and neck pain.  Skin: Negative for color change, pallor and rash.  Allergic/Immunologic: Negative for environmental allergies, food allergies and immunocompromised state.  Neurological: Positive for headaches. Negative for dizziness, speech difficulty, weakness and numbness.  Hematological: Negative for adenopathy.  Psychiatric/Behavioral: Negative for confusion and agitation. The patient is not nervous/anxious.        Objective:   Physical Exam  Constitutional: She is oriented to person, place, and time. She appears well-developed and well-nourished. No distress.  HENT:  Head: Normocephalic.  Mouth/Throat: Oropharynx is clear and moist. No oropharyngeal exudate.  Eyes: Conjunctivae and EOM are normal. Pupils are equal, round, and reactive to light. No scleral icterus.  Neck: Normal range of motion. Neck supple. No tracheal deviation present.  Cardiovascular: Normal rate, regular rhythm and intact distal pulses.   Pulmonary/Chest: Effort normal and breath sounds normal. No stridor. No respiratory distress. She exhibits no tenderness.  Abdominal: Soft. She exhibits no distension and no mass. There is no tenderness. Hernia confirmed negative in the right inguinal area and  confirmed negative in the left inguinal area.  Genitourinary:    No vaginal discharge found.  Exam done with assistance of female Medical Assistant in the room.  Perianal skin clean with good hygiene.  No pruritis.  No pilonidal disease.  No fissure.  No abscess/fistula.    Normal sphincter tone.  Tolerates digital and anoscopic rectal exam.  No rectal masses.  Hemorrhoidal piles Enlarged internally.  Prolapse is noted on diagram.  L&R Anterior.  Left lateral least enlrged   Musculoskeletal: Normal range of motion. She exhibits no tenderness.       Right elbow: She exhibits normal range of motion.       Left elbow: She exhibits normal range of motion.       Right wrist: She exhibits normal range of motion.       Left wrist: She exhibits normal range of motion.       Right hand: Normal strength noted.       Left hand: Normal strength noted.  Lymphadenopathy:       Head (right side): No posterior auricular adenopathy present.       Head (left side): No posterior auricular adenopathy present.    She has no cervical adenopathy.    She has no axillary adenopathy.       Right: No inguinal adenopathy present.       Left: No inguinal adenopathy present.  Neurological: She is alert and oriented to person, place, and time. No cranial nerve deficit. She exhibits normal muscle tone. Coordination normal.  Skin: Skin is warm and dry. No rash noted. She is not diaphoretic. No erythema.  Psychiatric: She has a normal mood and affect. Her behavior is normal. Judgment and thought content normal.       Assessment:     Significant external hemorrhoids and internal hemorrhoids with bleeding/pain/prolapse.     Plan:     I think this is too much to manage in the office.  I recommended examination under anesthesia.  Internal hemorrhoidal ligation and pexy using THD system.  Remove external hemorrhoid ectomy zone remaining tissue.  I discussed with her.  She is interested in proceeding:   The anatomy &  physiology of the anorectal region was discussed.  The pathophysiology of hemorrhoids and differential diagnosis was discussed.  Natural history risks without surgery was discussed.   I stressed the importance of a bowel regimen to have Hughes soft bowel movements to minimize progression of disease.  Interventions such as sclerotherapy & banding were discussed.  The patient's symptoms are not adequately controlled by medicines and other non-operative treatments.  I feel the risks & problems of no surgery outweigh the operative risks; therefore, I recommended surgery to treat the hemorrhoids by ligation, pexy, and possible resection.  Risks such as bleeding, infection, urinary difficulties, need for further treatment, heart attack, death, and other risks were discussed.   I noted a good likelihood this will help address the problem.  Goals of post-operative recovery were discussed as well.  Possibility that this will not correct all symptoms was explained.  Post-operative pain, bleeding, constipation, and other problems after surgery were discussed.  We will work to minimize complications.   Educational handouts further explaining the pathology, treatment options, and bowel regimen were given as well.  Questions were answered.  The patient expresses understanding & wishes to proceed with surgery.

## 2014-01-26 ENCOUNTER — Encounter (INDEPENDENT_AMBULATORY_CARE_PROVIDER_SITE_OTHER): Payer: Self-pay

## 2014-03-17 ENCOUNTER — Encounter (INDEPENDENT_AMBULATORY_CARE_PROVIDER_SITE_OTHER): Payer: Self-pay | Admitting: *Deleted

## 2014-05-04 ENCOUNTER — Other Ambulatory Visit (INDEPENDENT_AMBULATORY_CARE_PROVIDER_SITE_OTHER): Payer: Self-pay | Admitting: Internal Medicine

## 2014-06-29 ENCOUNTER — Encounter (INDEPENDENT_AMBULATORY_CARE_PROVIDER_SITE_OTHER): Payer: Self-pay | Admitting: Internal Medicine

## 2014-06-29 ENCOUNTER — Ambulatory Visit (INDEPENDENT_AMBULATORY_CARE_PROVIDER_SITE_OTHER): Payer: BC Managed Care – PPO | Admitting: Internal Medicine

## 2014-06-29 VITALS — BP 114/76 | HR 82 | Temp 98.1°F | Resp 18 | Ht 62.0 in | Wt 166.0 lb

## 2014-06-29 DIAGNOSIS — K219 Gastro-esophageal reflux disease without esophagitis: Secondary | ICD-10-CM

## 2014-06-29 DIAGNOSIS — E538 Deficiency of other specified B group vitamins: Secondary | ICD-10-CM

## 2014-06-29 NOTE — Patient Instructions (Signed)
Notify if pantoprazole stops working ?

## 2014-06-29 NOTE — Progress Notes (Signed)
Presenting complaint;   followup for GERD.  Subjective:  Patient is 33 year old Caucasian female who has more than 10 year history of GERD who presents for yearly visit. She says her heartburn and regurgitation as well controlled as long as she takes her medication twice daily. If she skips his morning dose he has heartburn during the daytime and if she skips evening dose she has problems at night. She said she had blood work by Dr. Sudie BaileyKnowlton was found to have low B12 level and was begun on by mouth B12 level followup blood test was normal. She is not sure if she had vitamin D level. Family history is negative for pernicious anemia. She is not experiencing any side effects with pantoprazole. At one point she was on Nexium which worked very well but her co-pay is too high. Only time she has heartburn or regurgitation is if she eats late at night or with certain spicy foods. She denies dysphagia nausea vomiting melena or rectal bleeding. She has gained 16 pounds since her last visit.   Current Medications: Outpatient Encounter Prescriptions as of 06/29/2014  Medication Sig  . LORazepam (ATIVAN) 1 MG tablet Take 1 mg by mouth at bedtime.   . Norethin Ace-Eth Estrad-FE (GILDESS FE 1.5/30 PO) Take by mouth daily.    . pantoprazole (PROTONIX) 40 MG tablet TAKE 1 TABLET TWICE A DAY BEFORE MEALS  . sertraline (ZOLOFT) 100 MG tablet Take 100 mg by mouth daily.    . SUMAtriptan (IMITREX) 25 MG tablet 25 mg. As needed   . topiramate (TOPAMAX) 100 MG tablet Take 100 mg by mouth 2 (two) times daily.    . valACYclovir (VALTREX) 500 MG tablet 500 mg as needed.   . vitamin B-12 (CYANOCOBALAMIN) 1000 MCG tablet Take 1,000 mcg by mouth 2 (two) times daily.  . [DISCONTINUED] omeprazole-sodium bicarbonate (ZEGERID) 40-1100 MG per capsule Take 1 capsule by mouth daily before breakfast.     Objective: Blood pressure 114/76, pulse 82, temperature 98.1 F (36.7 C), temperature source Oral, resp. rate 18, height  5\' 2"  (1.575 m), weight 166 lb (75.297 kg), last menstrual period 06/29/2014. Patient is alert and in no acute distress. Conjunctiva is pink. Sclera is nonicteric Oropharyngeal mucosa is normal. No neck masses or thyromegaly noted. Cardiac exam with regular rhythm normal S1 and S2. No murmur or gallop noted. Lungs are clear to auscultation. Abdomen is soft and nontender without organomegaly or masses. No LE edema or clubbing noted.    Assessment:  #1. Chronic GERD. She had EGD in November 2010 which was within normal limits. She is requiring double dose PPI for symptom control. Now that she has gained weight it may be difficult to decrease her dose to once a day. At some point this will be offered. #2. Recent diagnosis of B12 deficiency. It remains to be seen if this is the result of chronic PPI therapy or unrelated.    Plan:  Patient advised to decrease calorie intake and try to exercise on regular basis in order to lose weight. Will get copy of recent blood work from Dr. Michelle NasutiKnowlton's office for review. Continue pantoprazole at 40 mg by mouth twice a day. Office visit in one year.

## 2014-12-14 ENCOUNTER — Encounter (INDEPENDENT_AMBULATORY_CARE_PROVIDER_SITE_OTHER): Payer: Self-pay

## 2015-02-01 ENCOUNTER — Telehealth (INDEPENDENT_AMBULATORY_CARE_PROVIDER_SITE_OTHER): Payer: Self-pay | Admitting: *Deleted

## 2015-02-01 NOTE — Telephone Encounter (Signed)
Patient called and states that she is having Right Upper Quadrant Pain Describes the pain as severe for 1 month. Concerned that this is her Gallbladder.   Per Dr.Rehman arrange Abdominal U/S RUQ Abdominal Pain.  Forwarded to Dewayne HatchAnn to arrange asap.

## 2015-02-02 ENCOUNTER — Other Ambulatory Visit (INDEPENDENT_AMBULATORY_CARE_PROVIDER_SITE_OTHER): Payer: Self-pay | Admitting: Internal Medicine

## 2015-02-02 DIAGNOSIS — R1011 Right upper quadrant pain: Secondary | ICD-10-CM

## 2015-02-02 NOTE — Telephone Encounter (Signed)
US sch'd 02/03/15 at 730 , patient's mother will give appt info to patient

## 2015-02-03 ENCOUNTER — Ambulatory Visit (HOSPITAL_COMMUNITY)
Admission: RE | Admit: 2015-02-03 | Discharge: 2015-02-03 | Disposition: A | Discharge status: Home / Self Care | Payer: BLUE CROSS/BLUE SHIELD | Source: Ambulatory Visit | Attending: Internal Medicine | Admitting: Internal Medicine

## 2015-02-03 ENCOUNTER — Ambulatory Visit (HOSPITAL_COMMUNITY): Payer: Self-pay

## 2015-02-03 ENCOUNTER — Telehealth (INDEPENDENT_AMBULATORY_CARE_PROVIDER_SITE_OTHER): Payer: Self-pay | Admitting: *Deleted

## 2015-02-03 DIAGNOSIS — R1011 Right upper quadrant pain: Secondary | ICD-10-CM | POA: Diagnosis not present

## 2015-02-03 DIAGNOSIS — R110 Nausea: Secondary | ICD-10-CM | POA: Insufficient documentation

## 2015-02-03 NOTE — Telephone Encounter (Signed)
Patient called - Results given about her Abdominal U/S per Dr.Rehman. Patient states that she is having excruciating pain at times. This is upper right quadrant (Ribcage) that radiates toward her back. She says that her right side today ,when she stands up she has to hold her side as it hurts.    She experiences nausea, no throwing up. When she eats fried foods,there are times that she has to go to the bathroom immediately. There are times that she can eat a sandwich with chips and may eat a little more than she should, the same thing happens.  She is taking Pantoprazole daily .Patient was previously on Zergerid but [employer] would not cover it. Patient was advised that Dr.Rehman wanted her to examined and she voices understanding. She wanted to see him, when I checked on appointments,she was given one for Monday. Due to the possible inclement weather, patient will be called and made aware that we may be closed. Will ask patient to consider seeing Terri for examination. Patient will see Camelia Engerri  02/04/15 @9 :30 am.

## 2015-02-04 ENCOUNTER — Ambulatory Visit (HOSPITAL_COMMUNITY)
Admission: RE | Admit: 2015-02-04 | Discharge: 2015-02-04 | Disposition: A | Discharge status: Home / Self Care | Payer: BLUE CROSS/BLUE SHIELD | Source: Ambulatory Visit | Attending: Internal Medicine | Admitting: Internal Medicine

## 2015-02-04 ENCOUNTER — Encounter (INDEPENDENT_AMBULATORY_CARE_PROVIDER_SITE_OTHER): Payer: Self-pay | Admitting: Internal Medicine

## 2015-02-04 ENCOUNTER — Ambulatory Visit (INDEPENDENT_AMBULATORY_CARE_PROVIDER_SITE_OTHER): Payer: BLUE CROSS/BLUE SHIELD | Admitting: Internal Medicine

## 2015-02-04 ENCOUNTER — Encounter (HOSPITAL_COMMUNITY): Payer: Self-pay

## 2015-02-04 VITALS — BP 130/92 | HR 78 | Temp 97.7°F | Resp 18 | Ht 62.0 in | Wt 172.3 lb

## 2015-02-04 DIAGNOSIS — R1011 Right upper quadrant pain: Secondary | ICD-10-CM | POA: Diagnosis present

## 2015-02-04 MED ORDER — SINCALIDE 5 MCG IJ SOLR
INTRAMUSCULAR | Status: AC
  Administered 2015-02-04: 1.57 ug via INTRAVENOUS
  Filled 2015-02-04: qty 5

## 2015-02-04 MED ORDER — STERILE WATER FOR INJECTION IJ SOLN
INTRAMUSCULAR | Status: AC
  Administered 2015-02-04: 1.57 mL via INTRAVENOUS
  Filled 2015-02-04: qty 10

## 2015-02-04 MED ORDER — TECHNETIUM TC 99M MEBROFENIN IV KIT
5.0000 | PACK | Freq: Once | INTRAVENOUS | Status: AC | PRN
  Administered 2015-02-04: 4.9 via INTRAVENOUS

## 2015-02-04 MED ORDER — SODIUM CHLORIDE 0.9 % IJ SOLN
INTRAMUSCULAR | Status: AC
  Filled 2015-02-04: qty 12

## 2015-02-04 MED ORDER — STERILE WATER FOR INJECTION IJ SOLN
INTRAMUSCULAR | Status: AC
  Filled 2015-02-04: qty 10

## 2015-02-04 NOTE — Progress Notes (Signed)
   Subjective:    Patient ID: Jaime Hughes, female    DOB: [birth year], 34 y.o.   MRN: [id number]  HPI Presents today with c/o pain rt upper abdomen and radiates into her shoulder blades. The majority of the pain is under her rib cage. Pain progressively worse. Pain for a couple of months.  All foods bother her. She has frequent acid reflux. (Hx of chronic GERD)./ If she skips a dose of Protonix she will have acid reflux.  Acid reflux has been worse in the last week. Appetite is good. If she eats, she has the pain. Sometimes she cannot stand up straight. She has been unable to lose weight. She has gained 7 pounds since her visit in July She has frequent belching and flatus.  No melena or BRRB. Stools are brown.  Very little pain today however she has not eaten   Review of Systems Past Medical History  Diagnosis Date  . Reflux   . Heartburn   . Migraines   . Depression   . Anxiety     Past Surgical History  Procedure Laterality Date  . Tonsillectomy    . Upper gastrointestinal endoscopy    . Wisdom tooth extraction  01/2009    x2    Allergies  Allergen Reactions  . Augmentin [Amoxicillin-Pot Clavulanate] Rash  . Codeine   . Sulfonamide Derivatives     Current Outpatient Prescriptions on File Prior to Visit  Medication Sig Dispense Refill  . LORazepam (ATIVAN) 1 MG tablet Take 1 mg by mouth at bedtime.     . Norethin Ace-Eth Estrad-FE (GILDESS FE 1.5/30 PO) Take by mouth daily.      . pantoprazole (PROTONIX) 40 MG tablet TAKE 1 TABLET TWICE A DAY BEFORE MEALS 180 tablet 2  . sertraline (ZOLOFT) 100 MG tablet Take 100 mg by mouth daily.      . SUMAtriptan (IMITREX) 25 MG tablet 25 mg. As needed     . topiramate (TOPAMAX) 100 MG tablet Take 100 mg by mouth 2 (two) times daily.      . valACYclovir (VALTREX) 500 MG tablet 500 mg as needed.     . vitamin B-12 (CYANOCOBALAMIN) 1000 MCG tablet Take 1,000 mcg by mouth 2 (two) times daily.     No current facility-administered  medications on file prior to visit.        Objective:   Physical Exam   Filed Vitals:   02/04/15 0935  Height: 5\' 2"  (1.575 m)  Weight: 172 lb 4.8 oz (78.155 kg)   Alert and oriented. Skin warm and dry. Oral mucosa is moist.   . Sclera anicteric, conjunctivae is pink. Thyroid not enlarged. No cervical lymphadenopathy. Lungs clear. Heart regular rate and rhythm.  Abdomen is soft. Bowel sounds are positive. No hepatomegaly. No abdominal masses felt.slight tenderness under rt anterior rib cage.  No edema to lower extremities.         Assessment & Plan:  Rt upper quadrant pain. GB disease needs to be ruled out. Recent US was normal.  I discussed with Dr. Karilyn Cotaehman. NM HIDA scan. Dr. Karilyn Cotaehman will be called with results

## 2015-02-04 NOTE — Patient Instructions (Signed)
HIDA scan.

## 2015-02-05 LAB — HEPATIC FUNCTION PANEL
ALT: 12 U/L (ref 0–35)
AST: 14 U/L (ref 0–37)
Albumin: 4.1 g/dL (ref 3.5–5.2)
Alkaline Phosphatase: 51 U/L (ref 39–117)
BILIRUBIN DIRECT: 0.1 mg/dL (ref 0.0–0.3)
BILIRUBIN TOTAL: 0.5 mg/dL (ref 0.2–1.2)
Indirect Bilirubin: 0.4 mg/dL (ref 0.2–1.2)
TOTAL PROTEIN: 6.9 g/dL (ref 6.0–8.3)

## 2015-02-09 ENCOUNTER — Telehealth (INDEPENDENT_AMBULATORY_CARE_PROVIDER_SITE_OTHER): Payer: Self-pay | Admitting: *Deleted

## 2015-02-09 ENCOUNTER — Other Ambulatory Visit (INDEPENDENT_AMBULATORY_CARE_PROVIDER_SITE_OTHER): Payer: Self-pay | Admitting: Internal Medicine

## 2015-02-09 DIAGNOSIS — R140 Abdominal distension (gaseous): Secondary | ICD-10-CM

## 2015-02-09 DIAGNOSIS — R1011 Right upper quadrant pain: Secondary | ICD-10-CM

## 2015-02-09 DIAGNOSIS — R112 Nausea with vomiting, unspecified: Secondary | ICD-10-CM

## 2015-02-09 NOTE — Telephone Encounter (Signed)
Per Dr.Rehman arrange for an Abdominal/Pelvic CT with Contrast to be done. Patient states that she is experencing pain that is bad. Symptoms have been - Upper Right Quadrant Pain that radiates around to her back, N&V,Bloating, and Gas. If the patient feels that she needs pain medication per Dr.Rehman, Ms.Setzer  may write for a short supply of Hydrocodone 5/325. Patient at this time states that she does not want to take anything but will let us know if she changes her mind. If patient develops a fever she is to call our office, again per Dr.Rehman.

## 2015-02-09 NOTE — Telephone Encounter (Signed)
Insurance will not approve CT abd/pelvic due to symptoms don't warrant pelvic area, it will only approve CT abd, I have spoken to Dr Karilyn Cotaehman and he is agreeable to CT abd and this has been sch'd for 02/11/15 at 345, patient is aware

## 2015-02-10 ENCOUNTER — Ambulatory Visit (HOSPITAL_COMMUNITY)
Admission: RE | Admit: 2015-02-10 | Discharge: 2015-02-10 | Disposition: A | Discharge status: Home / Self Care | Payer: BLUE CROSS/BLUE SHIELD | Source: Ambulatory Visit | Attending: Internal Medicine | Admitting: Internal Medicine

## 2015-02-10 DIAGNOSIS — R112 Nausea with vomiting, unspecified: Secondary | ICD-10-CM | POA: Diagnosis not present

## 2015-02-10 DIAGNOSIS — R1011 Right upper quadrant pain: Secondary | ICD-10-CM | POA: Diagnosis present

## 2015-02-10 DIAGNOSIS — R140 Abdominal distension (gaseous): Secondary | ICD-10-CM | POA: Insufficient documentation

## 2015-02-10 MED ORDER — IOHEXOL 300 MG/ML  SOLN
100.0000 mL | Freq: Once | INTRAMUSCULAR | Status: AC | PRN
  Administered 2015-02-10: 100 mL via INTRAVENOUS

## 2015-02-10 NOTE — Telephone Encounter (Signed)
Patient is having Abdominal U/S today.

## 2015-02-11 ENCOUNTER — Other Ambulatory Visit (HOSPITAL_COMMUNITY): Payer: BLUE CROSS/BLUE SHIELD

## 2015-02-15 ENCOUNTER — Telehealth (INDEPENDENT_AMBULATORY_CARE_PROVIDER_SITE_OTHER): Payer: Self-pay | Admitting: *Deleted

## 2015-02-15 NOTE — Telephone Encounter (Signed)
Patient should return for office visit in 8 weeks.

## 2015-02-15 NOTE — Telephone Encounter (Signed)
Patient called 02/14/15. She questioned if she dieted , loss weight this may help her symptoms. Patient would like to try this prior to going to a Surgeon for her symptoms. Per Dr.Rehman , patient may go on a low fat diet , and a follow up visit with him in 4 weeks.  A copy of a low fat diet , and a gluten free diet was faxed to the patient. Forwarded to [employer] for a 4 week appointment with Dr.Rehman.

## 2015-02-16 NOTE — Telephone Encounter (Signed)
Forwarded to Donna

## 2015-02-16 NOTE — Telephone Encounter (Signed)
Jaime Hughes - per Dr.Rehman's last note patient will need a OV in 8 weeks.

## 2015-02-18 NOTE — Telephone Encounter (Signed)
Apt has been scheduled for 04/26/15 at 11:30 am with Dr. Karilyn Cotaehman.

## 2015-04-26 ENCOUNTER — Ambulatory Visit (INDEPENDENT_AMBULATORY_CARE_PROVIDER_SITE_OTHER): Payer: BLUE CROSS/BLUE SHIELD | Admitting: Internal Medicine

## 2015-06-13 ENCOUNTER — Ambulatory Visit (HOSPITAL_COMMUNITY)
Admission: RE | Admit: 2015-06-13 | Discharge: 2015-06-13 | Disposition: A | Discharge status: Home / Self Care | Payer: BLUE CROSS/BLUE SHIELD | Source: Ambulatory Visit | Attending: Family Medicine | Admitting: Family Medicine

## 2015-06-13 ENCOUNTER — Other Ambulatory Visit (HOSPITAL_COMMUNITY): Payer: Self-pay | Admitting: Family Medicine

## 2015-06-13 DIAGNOSIS — M25511 Pain in right shoulder: Secondary | ICD-10-CM | POA: Diagnosis not present

## 2015-06-13 DIAGNOSIS — M79671 Pain in right foot: Secondary | ICD-10-CM | POA: Insufficient documentation

## 2015-06-15 ENCOUNTER — Other Ambulatory Visit (INDEPENDENT_AMBULATORY_CARE_PROVIDER_SITE_OTHER): Payer: Self-pay | Admitting: Internal Medicine

## 2015-06-15 MED ORDER — PANTOPRAZOLE SODIUM 40 MG PO TBEC: DELAYED_RELEASE_TABLET | ORAL | Status: DC

## 2015-06-15 NOTE — Telephone Encounter (Signed)
Rx for Protonix sent.

## 2016-04-20 IMAGING — NM NM HEPATO W/GB/PHARM/[PERSON_NAME]
2 series · 12 of 12 positions shown · non-contrast
Comparison: 10/20/2012

CLINICAL DATA: Right upper quadrant pain beginning 2 months ago.

EXAM:
NUCLEAR MEDICINE HEPATOBILIARY IMAGING WITH GALLBLADDER EF
TECHNIQUE: Sequential images of the abdomen were obtained [DATE] minutes
following intravenous administration of radiopharmaceutical. After
slow intravenous infusion of 1.57 micrograms Cholecystokinin,
gallbladder ejection fraction was determined.
RADIOPHARMACEUTICALS:  4.9 [place] Sc-WWm Choletec

[Series 1: biliary · 3.25mm/px · 6 of 60 frames shown]
[frame 6/60]
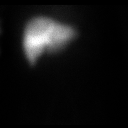
[frame 16/60]
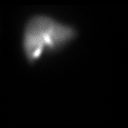
[frame 26/60]
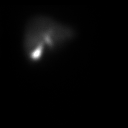
[frame 36/60]
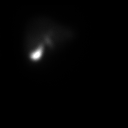
[frame 46/60]
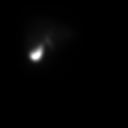
[frame 56/60]
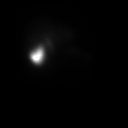

[Series 2: gbef · 3.25mm/px · 6 of 45 frames shown]
[frame 4/45]
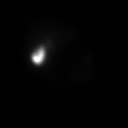
[frame 12/45]
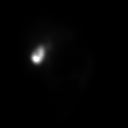
[frame 19/45]
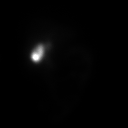
[frame 27/45]
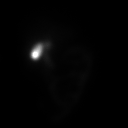
[frame 34/45]
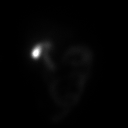
[frame 42/45]
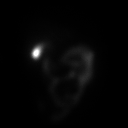

[12 of 12 positions shown; findings below may reference images not displayed]

FINDINGS: Normal homogeneous uptake in the liver. No abnormal background
activity. The central biliary tree is noted within 5 min and
gallbladder appears within 10 min. Bowel was noted by 45 min..
Gallbladder ejection fraction at ~78min is 62%. This is slightly
overestimated based on patient movement relative to the region of
interest, but ejection fraction is still within normal range (at 45
min, normal ejection fraction is greater than 40%).

The patient experienced abdominal discomfort during CCK infusion.
IMPRESSION: Patent cystic and common bile ducts. Normal gallbladder ejection
fraction of ~60% at 40 min.

## 2016-09-07 ENCOUNTER — Other Ambulatory Visit (INDEPENDENT_AMBULATORY_CARE_PROVIDER_SITE_OTHER): Payer: Self-pay | Admitting: Internal Medicine

## 2017-03-13 ENCOUNTER — Telehealth (INDEPENDENT_AMBULATORY_CARE_PROVIDER_SITE_OTHER): Payer: Self-pay | Admitting: *Deleted

## 2017-03-13 ENCOUNTER — Other Ambulatory Visit (INDEPENDENT_AMBULATORY_CARE_PROVIDER_SITE_OTHER): Payer: Self-pay | Admitting: *Deleted

## 2017-03-13 DIAGNOSIS — R110 Nausea: Secondary | ICD-10-CM

## 2017-03-13 DIAGNOSIS — R1011 Right upper quadrant pain: Secondary | ICD-10-CM

## 2017-03-13 NOTE — Telephone Encounter (Signed)
Patient called and states that she is having severe URQ pain and nausea.  Discussed with Dr.Rehman who states that she needs to have the U/S Abdomen. Order has been placed and patient called.  Forwarded to HalmaAnn to arrange.

## 2017-03-13 NOTE — Telephone Encounter (Signed)
US sch'd 03/14/17 at 1030 ([id number]), left patient detailed message

## 2017-03-14 ENCOUNTER — Ambulatory Visit (HOSPITAL_COMMUNITY)
Admission: RE | Admit: 2017-03-14 | Discharge: 2017-03-14 | Disposition: A | Discharge status: Home / Self Care | Payer: BLUE CROSS/BLUE SHIELD | Source: Ambulatory Visit | Attending: Internal Medicine | Admitting: Internal Medicine

## 2017-03-14 ENCOUNTER — Ambulatory Visit (HOSPITAL_COMMUNITY): Payer: BLUE CROSS/BLUE SHIELD

## 2017-03-14 DIAGNOSIS — R1011 Right upper quadrant pain: Secondary | ICD-10-CM | POA: Insufficient documentation

## 2017-03-14 DIAGNOSIS — R110 Nausea: Secondary | ICD-10-CM | POA: Insufficient documentation

## 2017-04-09 ENCOUNTER — Encounter (INDEPENDENT_AMBULATORY_CARE_PROVIDER_SITE_OTHER): Payer: Self-pay

## 2017-08-31 ENCOUNTER — Other Ambulatory Visit (INDEPENDENT_AMBULATORY_CARE_PROVIDER_SITE_OTHER): Payer: Self-pay | Admitting: Internal Medicine

## 2018-09-11 ENCOUNTER — Encounter (INDEPENDENT_AMBULATORY_CARE_PROVIDER_SITE_OTHER): Payer: Self-pay | Admitting: Internal Medicine

## 2018-09-11 ENCOUNTER — Ambulatory Visit (INDEPENDENT_AMBULATORY_CARE_PROVIDER_SITE_OTHER): Payer: BLUE CROSS/BLUE SHIELD | Admitting: Internal Medicine

## 2018-11-27 ENCOUNTER — Other Ambulatory Visit (INDEPENDENT_AMBULATORY_CARE_PROVIDER_SITE_OTHER): Payer: Self-pay | Admitting: Internal Medicine

## 2018-12-12 ENCOUNTER — Other Ambulatory Visit (HOSPITAL_COMMUNITY): Payer: Self-pay | Admitting: Family Medicine

## 2018-12-12 ENCOUNTER — Ambulatory Visit (HOSPITAL_COMMUNITY)
Admission: RE | Admit: 2018-12-12 | Discharge: 2018-12-12 | Disposition: A | Discharge status: Home / Self Care | Payer: BLUE CROSS/BLUE SHIELD | Source: Ambulatory Visit | Attending: Family Medicine | Admitting: Family Medicine

## 2018-12-12 DIAGNOSIS — R1032 Left lower quadrant pain: Secondary | ICD-10-CM | POA: Diagnosis present

## 2018-12-12 DIAGNOSIS — R1012 Left upper quadrant pain: Secondary | ICD-10-CM

## 2018-12-12 LAB — POCT I-STAT, CHEM 8
BUN: 9 mg/dL (ref 6–20)
Calcium, Ion: 1.22 mmol/L (ref 1.15–1.40)
Chloride: 102 mmol/L (ref 98–111)
Creatinine, Ser: 0.6 mg/dL (ref 0.44–1.00)
Glucose, Bld: 77 mg/dL (ref 70–99)
HCT: 47 % — ABNORMAL HIGH (ref 36.0–46.0)
HEMOGLOBIN: 16 g/dL — AB (ref 12.0–15.0)
Potassium: 3.7 mmol/L (ref 3.5–5.1)
Sodium: 139 mmol/L (ref 135–145)
TCO2: 27 mmol/L (ref 22–32)

## 2018-12-12 MED ORDER — IOPAMIDOL (ISOVUE-300) INJECTION 61%
100.0000 mL | Freq: Once | INTRAVENOUS | Status: AC | PRN
  Administered 2018-12-12: 100 mL via INTRAVENOUS

## 2019-09-11 ENCOUNTER — Other Ambulatory Visit (HOSPITAL_COMMUNITY): Payer: Self-pay | Admitting: Family Medicine

## 2019-09-11 DIAGNOSIS — R110 Nausea: Secondary | ICD-10-CM

## 2019-09-11 DIAGNOSIS — R1011 Right upper quadrant pain: Secondary | ICD-10-CM

## 2019-09-14 ENCOUNTER — Other Ambulatory Visit: Payer: Self-pay

## 2019-09-14 ENCOUNTER — Ambulatory Visit (HOSPITAL_COMMUNITY)
Admission: RE | Admit: 2019-09-14 | Discharge: 2019-09-14 | Disposition: A | Discharge status: Home / Self Care | Payer: BC Managed Care – PPO | Source: Ambulatory Visit | Attending: Family Medicine | Admitting: Family Medicine

## 2019-09-14 DIAGNOSIS — R110 Nausea: Secondary | ICD-10-CM | POA: Diagnosis not present

## 2019-09-14 DIAGNOSIS — R1011 Right upper quadrant pain: Secondary | ICD-10-CM | POA: Diagnosis present

## 2019-09-15 ENCOUNTER — Other Ambulatory Visit (HOSPITAL_COMMUNITY): Payer: Self-pay | Admitting: Nurse Practitioner

## 2019-09-15 DIAGNOSIS — R1011 Right upper quadrant pain: Secondary | ICD-10-CM

## 2019-09-15 DIAGNOSIS — R110 Nausea: Secondary | ICD-10-CM

## 2019-09-17 ENCOUNTER — Encounter (HOSPITAL_COMMUNITY)
Admission: RE | Admit: 2019-09-17 | Discharge: 2019-09-17 | Disposition: A | Discharge status: Home / Self Care | Payer: BC Managed Care – PPO | Source: Ambulatory Visit | Attending: Nurse Practitioner | Admitting: Nurse Practitioner

## 2019-09-17 ENCOUNTER — Other Ambulatory Visit: Payer: Self-pay

## 2019-09-17 DIAGNOSIS — R1011 Right upper quadrant pain: Secondary | ICD-10-CM | POA: Diagnosis not present

## 2019-09-17 DIAGNOSIS — R110 Nausea: Secondary | ICD-10-CM | POA: Diagnosis not present

## 2019-09-17 MED ORDER — TECHNETIUM TC 99M MEBROFENIN IV KIT
5.0000 | PACK | Freq: Once | INTRAVENOUS | Status: AC | PRN
  Administered 2019-09-17: 5.18 via INTRAVENOUS

## 2019-09-30 ENCOUNTER — Other Ambulatory Visit: Payer: Self-pay

## 2019-09-30 DIAGNOSIS — Z20822 Contact with and (suspected) exposure to covid-19: Secondary | ICD-10-CM

## 2019-10-02 LAB — NOVEL CORONAVIRUS, NAA: SARS-CoV-2, NAA: NOT DETECTED

## 2020-06-17 ENCOUNTER — Encounter: Payer: Self-pay | Admitting: Allergy & Immunology

## 2020-06-17 ENCOUNTER — Ambulatory Visit: Payer: BC Managed Care – PPO | Admitting: Allergy & Immunology

## 2020-06-17 ENCOUNTER — Other Ambulatory Visit: Payer: Self-pay

## 2020-06-17 VITALS — BP 118/72 | HR 102 | Temp 99.5°F | Resp 16 | Ht 63.5 in | Wt 189.8 lb

## 2020-06-17 DIAGNOSIS — J3089 Other allergic rhinitis: Secondary | ICD-10-CM | POA: Insufficient documentation

## 2020-06-17 DIAGNOSIS — T7800XA Anaphylactic reaction due to unspecified food, initial encounter: Secondary | ICD-10-CM | POA: Insufficient documentation

## 2020-06-17 DIAGNOSIS — J302 Other seasonal allergic rhinitis: Secondary | ICD-10-CM | POA: Insufficient documentation

## 2020-06-17 DIAGNOSIS — T7800XD Anaphylactic reaction due to unspecified food, subsequent encounter: Secondary | ICD-10-CM | POA: Diagnosis not present

## 2020-06-17 NOTE — Patient Instructions (Addendum)
1. Chronic rhinitis - Testing today showed: trees, indoor molds and outdoor molds - Copy of test results provided.  - Avoidance measures provided. - Continue with: Zyrtec (cetirizine) 10mg  tablet once daily (you can use twice daily on particularly bad days) - Consider nasal saline rinses 1-2 times daily to remove allergens from the nasal cavities as well as help with mucous clearance (this is especially helpful to do before the nasal sprays are given) - I do not think that we need to be more aggressive at this point since you are fairly stable with the Zyrtec alone.   2. Anaphylactic shock due to food - Testing was negative to everything except for almond. - However since you are eating this without a problem, I think that this is a false positive. - We are going to get lab work to look at shellfish and molluscs.  - We are also going to get an alpha gal panel on the off chance that this is what is going on with you.  - We will hold off on an epinephrine auto-injector in the meantime to avoid paying for something that might not be necessary.  - We will call you in 1-2 weeks with the results of the testing.  - In the meantime, monitor other reactions and note any triggers at all.   3. Return in about 3 months (around 09/17/2020). This can be an in-person, a virtual Webex or a telephone follow up visit.   Please inform us of any Emergency Department visits, hospitalizations, or changes in symptoms. Call us before going to the ED for breathing or allergy symptoms since we might be able to fit you in for a sick visit. Feel free to contact us anytime with any questions, problems, or concerns.  It was a pleasure to meet you today!  Websites that have reliable patient information: 1. American Academy of Asthma, Allergy, and Immunology: www.aaaai.org 2. Food Allergy Research and Education (FARE): foodallergy.org 3. Mothers of Asthmatics: http://www.asthmacommunitynetwork.org 4. American College of  Allergy, Asthma, and Immunology: www.acaai.org   COVID-19 Vaccine Information can be found at: ShippingScam.co.uk For questions related to vaccine distribution or appointments, please email [email or web address] or call [phone].     "Like" [place] on Facebook and Instagram for our latest updates!        Make sure you are registered to vote! If you have moved or changed any of your contact information, you will need to get this updated before voting!  In some cases, you MAY be able to register to vote online: [email or web address]    Control of [place] and fungi can grow on a variety of surfaces provided certain temperature and moisture conditions exist.  Outdoor molds grow on plants, decaying vegetation and soil.  The major outdoor mold, Alternaria and Cladosporium, are found in very high numbers during hot and dry conditions.  Generally, a late Summer - Fall peak is seen for common outdoor fungal spores.  Rain will temporarily lower outdoor mold spore count, but counts rise rapidly when the rainy period ends.  The most important indoor molds are Aspergillus and Penicillium.  Dark, humid and poorly ventilated basements are ideal sites for mold growth.  The next most common sites of mold growth are the bathroom and the kitchen.  Outdoor (Seasonal) Mold Control  Positive outdoor molds via skin testing: Alternaria, Bipolaris (Helminthsporium), Drechslera (Curvalaria) and Mucor  1. Use air conditioning and keep windows closed 2. Avoid exposure to decaying vegetation. 3. Avoid leaf raking.  4. Avoid grain handling. 5. Consider wearing a face mask if working in moldy areas.  6.   Indoor (Perennial) Mold Control   Positive indoor molds via skin testing: Aspergillus and Penicillium  1. Maintain humidity below 50%. 2. Clean washable surfaces with 5% bleach solution. 3. Remove sources e.g.  contaminated carpets.     Reducing Pollen Exposure  The American Academy of Allergy, Asthma and Immunology suggests the following steps to reduce your exposure to pollen during allergy seasons.    1. Do not hang sheets or clothing out to dry; pollen may collect on these items. 2. Do not mow lawns or spend time around freshly cut grass; mowing stirs up pollen. 3. Keep windows closed at night.  Keep car windows closed while driving. 4. Minimize morning activities outdoors, a time when pollen counts are usually at their highest. 5. Stay indoors as much as possible when pollen counts or humidity is high and on windy days when pollen tends to remain in the air longer. 6. Use air conditioning when possible.  Many air conditioners have filters that trap the pollen spores. 7. Use a HEPA room air filter to remove pollen form the indoor air you breathe.

## 2020-06-17 NOTE — Progress Notes (Signed)
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 false positive)  Plan/Recommendations:   1. Chronic rhinitis - Testing today showed: trees, indoor molds and outdoor molds - Copy of test results provided.  - Avoidance measures provided. - Continue with: Zyrtec (cetirizine) 10mg  tablet once daily (you can use twice daily on particularly bad days) - Consider nasal saline rinses 1-2 times daily to remove allergens from the nasal cavities as well as help with mucous clearance (this is especially helpful to do before the nasal sprays are given) - I do not think that we need to be more aggressive at this point since you are fairly stable with the Zyrtec alone.   2. Anaphylactic shock due to food - Testing was negative to everything except for almond. - However since you are eating this without a problem, I think that this is a false positive. - We are going to get lab work to look at shellfish and molluscs.  - We are also going to get an alpha gal panel on the off chance that this is what is going on with you.  - We will hold off on an epinephrine auto-injector in the meantime to avoid paying for something that might not be necessary.  - We will call you in 1-2 weeks with the results of the testing.  - In the meantime, monitor other reactions and note any triggers at all.   3. Return in about 3 months (around 09/17/2020). This can be an in-person, a virtual Webex or a telephone follow up visit.  Subjective:   Jaime Hughes Hughes is a 39 y.o. female presenting today for evaluation of  Chief Complaint  Patient presents with  . Allergic Reaction    Jaime Hughes Everett has a history of the following: Patient Active Problem List   Diagnosis Date Noted  . Seasonal and  perennial allergic rhinitis 06/17/2020  . Anaphylactic shock due to adverse food reaction 06/17/2020  . B12 deficiency 06/29/2014  . Internal hemorrhoids with prolapse,pain, and bleeding 01/22/2014  . External hemorrhoids with pain & irritation 01/22/2014  . Abdominal pain, right upper quadrant 10/14/2012  . GERD (gastroesophageal reflux disease) 11/27/2011  . History of migraine headaches 11/27/2011  . 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  Jaime Hughes Jaime Hughes was referred by Gareth MorganKnowlton, Steve, MD.     Jaime Hughes is a 39 y.o. female presenting for an evaluation of food and environmental allergies.  She reacts to scallops, clams, and oysters with stomach pain, vomiting, and becoming a "puffer fish" per the patient. Her last reaction was 6-8 months ago. She also reports that she gets itchy even without exposure to these triggers. Symptoms started around 10 years ago and have worsened since that time. She never had testing performed and she has no EpiPen. She has been treated with prednisone in the past but non epinephrine. There ws another episode with crab cakes which resulted in headaches. She did have itching on her face. ALL of her reactions have occurred when she is outside of the home. She does use a lot of seasonings in her own cooking.   She has tolerated shrimp and lobster without a problem. She tolerated fin fish without issues. She tolerates milk, eggs, and wheat without a problem.  However she mentions another time that this might be related to gluten. She does tolerate a lot of seasonings at home without issues.   Allergic Rhinitis Symptom History: She does have seasonal allergies. She will take Benadryl or Zyrtec with relief of her symptoms. She does not take it every day. She is interested in environmental testing.   Eczema Symptom History: She does have eczema. She uses cortisone cream over the counter with improvement. She moisutrizes with [place] and  [employer] lotion; she never reacts to that at all. She never needs antibiotics for Staphylococcal infections.   She does have multiple drug allergies, including sulfa antibiotics and penicillins.   Otherwise, there is no history of other atopic diseases, including asthma, drug allergies, stinging insect allergies or contact dermatitis. There is no significant infectious history. Vaccinations are up to date.    Past Medical History: Patient Active Problem List   Diagnosis Date Noted  . Seasonal and perennial allergic rhinitis 06/17/2020  . Anaphylactic shock due to adverse food reaction 06/17/2020  . B12 deficiency 06/29/2014  . Internal hemorrhoids with prolapse,pain, and bleeding 01/22/2014  . External hemorrhoids with pain & irritation 01/22/2014  . Abdominal pain, right upper quadrant 10/14/2012  . GERD (gastroesophageal reflux disease) 11/27/2011  . History of migraine headaches 11/27/2011  . 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 If you have any questions, ask your nurse or doctor.        STOP taking these medications   LORazepam 1 MG tablet Commonly known as: ATIVAN Stopped by: Alfonse Spruce, MD   SUMAtriptan 25 MG tablet Commonly known as: IMITREX Stopped by: Alfonse Spruce, MD   topiramate 100 MG tablet Commonly known as: TOPAMAX Stopped by: Alfonse Spruce, MD     TAKE these medications   GILDESS FE 1.5/30 PO Take by mouth daily.   Aurovela Fe 1.5/30 1.5-30 MG-MCG tablet Generic drug: norethindrone-ethinyl estradiol-iron Take 1 tablet by mouth daily.   lisinopril 10 MG  tablet Commonly known as: ZESTRIL Take 10-20 mg by mouth daily.   pantoprazole 40 MG tablet Commonly known as: PROTONIX TAKE 1 TABLET TWICE A DAY  BEFORE MEALS   sertraline 100 MG tablet Commonly known as: ZOLOFT Take 100 mg by mouth daily.   valACYclovir 500 MG tablet Commonly known as: VALTREX 500 mg as needed.   vitamin B-12 1000 MCG tablet Commonly known as: CYANOCOBALAMIN Take 1,000 mcg by mouth 2 (two) times daily.   Vitamin D (Ergocalciferol) 1.25 MG (50000 UNIT) Caps capsule Commonly known as: DRISDOL Take 50,000 Units by mouth once a week.       Birth History: non-contributory  Developmental History: non-contributory  Past Surgical History: Past Surgical History:  Procedure Laterality Date  . TONSILLECTOMY    . UPPER GASTROINTESTINAL ENDOSCOPY    . WISDOM TOOTH EXTRACTION  01/2009   x2     Family History: Family History  Problem Relation Age of Onset  . Breast cancer Mother   . Hypertension Father   . Diabetes Maternal Uncle   .  Stroke Maternal Uncle   . Cancer Other        family history   . Diabetes Other        family history   . Arthritis Other        family history      Social History: Malaijah lives at home with her family.  She has not has it is 40 years old.  There is carpeting in the main living areas as well as the bedrooms.  They have electric heating and central cooling.  There is a dog inside of the home.  There is a cat inside of the home as well.  There are no dust mite covers.  There is no tobacco exposure.  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  There are no chemical or dust exposures.  She does not have a HEPA filter in the home.  She does not live near an interstate or industrial area.   Review of Systems  Constitutional: Negative.  Negative for chills, fever, malaise/fatigue and weight loss.  HENT: Negative.  Negative for congestion, ear discharge, ear pain, sinus pain  and sore throat.   Eyes: Negative for pain, discharge and redness.  Respiratory: Negative for cough, sputum production, shortness of breath and wheezing.   Cardiovascular: Negative.  Negative for chest pain and palpitations.  Gastrointestinal: Negative for abdominal pain, constipation, diarrhea, heartburn, nausea and vomiting.  Skin: Negative.  Negative for itching and rash.  Neurological: Negative for dizziness and headaches.  Endo/Heme/Allergies: Negative for environmental allergies. Does not bruise/bleed easily.       Objective:   Blood pressure 118/72, pulse (!) 102, temperature 99.5 F (37.5 C), temperature source Temporal, resp. rate 16, height 5' 3.5" (1.613 m), weight 189 lb 12.8 oz (86.1 kg), SpO2 96 %. Body mass index is 33.09 kg/m.   Physical Exam: General:  alert, active, in no acute distress. Pleasant female. Head:  normocephalic, no masses, lesions, tenderness or abnormalities Eyes:  conjunctiva clear without injection or discharge, EOMI, PERL Ears:  TM's pearly white bilaterally, external auditory canals are clear, external ears are normally set and rotated Nose:  External nose within normal limits, normal-appearing turbinates, clear-colored discharge, septum midline Throat:  moist mucous membranes without erythema, exudates or petechiae, no thrush Neck:  Supple without thyromegaly or adenopathy appreciated Lungs:  clear to auscultation, no wheezing, crackles or rhonchi, breathing unlabored, moving air well in all lung fields Heart:  regular rate and rhythm, normal S1/S2, no murmurs or gallops, normal peripheral perfusion Neuro:  Normal mental status, speech normal, alert and oriented x3 Musculoskeletal:  no cyanosis, clubbing or edema Skin:  skin color, texture and turgor are normal; no bruising, rashes or lesions noted. Psych: Normal Affect and mood   Diagnostic studies:     Allergy Studies:     Airborne Adult Perc - 06/17/20 0900    Time Antigen Placed 6045     Allergen Manufacturer Lavella Hammock    Location Back    Number of Test 59    1. Control-Buffer 50% Glycerol Negative    2. Control-Histamine 1 mg/ml 2+    3. Albumin saline Negative    4. [place] Negative    5. [place] Negative    6. Johnson Negative    7. [place] Blue Negative    8. Meadow Fescue Negative    9. Perennial Rye Negative    10. Sweet Vernal Negative    11. Timothy Negative  12. Cocklebur Negative    13. Burweed Marshelder Negative    14. Ragweed, short Negative    15. Ragweed, Giant Negative    16. Plantain,  English Negative    17. Lamb's Quarters Negative    18. Sheep Sorrell Negative    19. Rough Pigweed Negative    20. Marsh Elder, Rough Negative    21. Mugwort, Common Negative    22. Ash mix Negative    23. Birch mix Negative    24. Beech American Negative    25. Box, Elder Negative    26. Cedar, red Negative    27. Cottonwood, [place] Negative    28. Elm mix Negative    29. Hickory Negative    30. Maple mix Negative    31. Oak, [place] mix Negative    32. Pecan Pollen Negative    33. Pine mix Negative    34. Sycamore Eastern Negative    35. Walnut, Black Pollen Negative    36. Alternaria alternata 3+    37. Cladosporium Herbarum Negative    38. Aspergillus mix Negative    39. Penicillium mix Negative    40. Bipolaris sorokiniana (Helminthosporium) Negative    41. Drechslera spicifera (Curvularia) Negative    42. Mucor plumbeus Negative    43. Fusarium moniliforme Negative    44. Aureobasidium pullulans (pullulara) Negative    45. Rhizopus oryzae Negative    46. Botrytis cinera Negative    47. Epicoccum nigrum Negative    48. Phoma betae Negative    49. Candida Albicans Negative    50. Trichophyton mentagrophytes Negative    51. Mite, D Farinae  5,000 AU/ml Negative    52. Mite, D Pteronyssinus  5,000 AU/ml Negative    53. Cat Hair 10,000 BAU/ml Negative    54.  Dog Epithelia Negative    55. Mixed Feathers Negative    56. Horse Epithelia  Negative    57. Cockroach, German Negative    58. Mouse Negative    59. Tobacco Leaf Negative          Intradermal - 06/17/20 0943    Time Antigen Placed 0867    Allergen Manufacturer Waynette Buttery    Location Back    Number of Test 14    Intradermal Select    Control Negative    [place] Negative    Johnson Negative    7 Grass Negative    Ragweed mix Negative    Weed mix Negative    Tree mix 2+    Mold 2 4+    Mold 3 3+    Mold 4 Negative    Cat Negative    Dog Negative    Cockroach Negative    Mite mix Negative          Food Adult Perc - 06/17/20 0900    Time Antigen Placed 6195    Allergen Manufacturer Waynette Buttery    Location Back    Number of allergen test 22    1. Peanut Negative    2. Soybean Negative    3. Wheat Negative    4. Sesame Negative    5. Milk, cow Negative    6. Egg White, Chicken Negative    7. Casein Negative    8. Shellfish Mix Negative    9. Fish Mix Negative    10. Cashew Negative    11. Pecan Food Negative    12. Walnut Food Negative    13. Almond Negative    14.  Hazelnut Negative    15. [place] nut Negative    16. Coconut Negative    17. Pistachio Negative    26. Crab Negative    28. Oyster Negative    29. Scallops Negative    33. Hops Negative    34. Rice Negative           Allergy testing results were read and interpreted by myself, documented by clinical staff.         Malachi Bonds, MD [facility]

## 2020-06-23 LAB — ALPHA-GAL PANEL
Alpha Gal IgE*: <0.1 kU/L (ref <0.10)
Beef (Bos spp) IgE: <0.1 kU/L (ref <0.35)
Class Interpretation: 0
Class Interpretation: 0
Class Interpretation: 0
Lamb/Mutton (Ovis spp) IgE: <0.1 kU/L (ref <0.35)
Pork (Sus spp) IgE: <0.1 kU/L (ref <0.35)

## 2020-06-23 LAB — ALLERGY PANEL 18, NUT MIX GROUP
Allergen Coconut IgE: <0.1 kU/L
F020-IgE Almond: 0.12 kU/L — AB
F202-IgE Cashew Nut: <0.1 kU/L
Hazelnut (Filbert) IgE: <0.1 kU/L
Peanut IgE: <0.1 kU/L
Pecan Nut IgE: <0.1 kU/L
Sesame Seed IgE: 0.14 kU/L — AB

## 2020-06-23 LAB — ALLERGEN, OYSTER, F290: F290-IgE Oyster: <0.1 kU/L

## 2020-06-23 LAB — ALLERGEN, CLAM, F207: Clam IgE: 0.12 kU/L — AB

## 2020-06-23 LAB — TRYPTASE: Tryptase: 4.4 ug/L (ref 2.2–13.2)

## 2020-06-23 LAB — ALLERGEN SCALLOPS F338: Scallop IgE: <0.1 kU/L

## 2020-08-30 ENCOUNTER — Ambulatory Visit
Admission: RE | Admit: 2020-08-30 | Discharge: 2020-08-30 | Disposition: A | Discharge status: Home / Self Care | Payer: BC Managed Care – PPO | Source: Ambulatory Visit | Attending: Emergency Medicine | Admitting: Emergency Medicine

## 2020-08-30 ENCOUNTER — Other Ambulatory Visit: Payer: Self-pay

## 2020-08-30 VITALS — BP 133/91 | HR 91 | Temp 98.3°F | Resp 18 | Ht 62.0 in | Wt 187.0 lb

## 2020-08-30 DIAGNOSIS — M545 Low back pain, unspecified: Secondary | ICD-10-CM

## 2020-08-30 MED ORDER — METHYLPREDNISOLONE SODIUM SUCC 125 MG IJ SOLR
80.0000 mg | Freq: Once | INTRAMUSCULAR | Status: AC
  Administered 2020-08-30: 80 mg via INTRAVENOUS

## 2020-08-30 MED ORDER — CYCLOBENZAPRINE HCL 5 MG PO TABS: 5.0000 mg | ORAL_TABLET | Freq: Three times a day (TID) | ORAL | 0 refills | Status: DC | PRN

## 2020-08-30 MED ORDER — PREDNISONE 10 MG PO TABS: 20.0000 mg | ORAL_TABLET | Freq: Every day | ORAL | 0 refills | Status: DC

## 2020-08-30 NOTE — Discharge Instructions (Addendum)
Rest, ice and heat as needed Ensure adequate ROM as tolerated. Continue Tylenol as needed for pain  Prescribed prednisone for inflammation prescribed flexeril  for muscle spasm.  Do not drive or operate heavy machinery while taking this medication Return here or go to ER if you have any new or worsening symptoms such as numbness/tingling of the inner thighs, loss of bladder or bowel control, headache/blurry vision, nausea/vomiting, confusion/altered mental status, dizziness, weakness, passing out, imbalance, etc..Marland Kitchen

## 2020-08-30 NOTE — ED Provider Notes (Signed)
[facility] CARE CENTER   [id number] 08/30/20 Arrival Time: 1539   Chief Complaint  Patient presents with  . Back Pain     SUBJECTIVE: History from: patient.  Jaime Hughes is a 39 y.o. female who presented to the urgent care for complaint of low back pain for the past 3 days.  Reports she heard a pop sound while trying to bend.  She localizes the pain to the left low back.  She describes the pain as constant and achy.  She has tried OTC medications without relief.  Her symptoms are made worse with ROM.  She denies similar symptoms in the past.  Denies chills, fever, nausea, vomiting, diarrhea.      ROS: As per HPI.  All other pertinent ROS negative.     Past Medical History:  Diagnosis Date  . Anxiety   . Depression   . Eczema   . Heartburn   . Migraines   . Recurrent upper respiratory infection (URI)   . Reflux    Past Surgical History:  Procedure Laterality Date  . TONSILLECTOMY    . UPPER GASTROINTESTINAL ENDOSCOPY    . WISDOM TOOTH EXTRACTION  01/2009   x2   Allergies  Allergen Reactions  . Amoxicillin-Pot Clavulanate Rash and Hives  . Codeine Swelling and Nausea And Vomiting    hives   . Sulfa Antibiotics Nausea And Vomiting  . Sulfamethoxazole Other (See Comments)    rash  . Sulfonamide Derivatives   . Tape Other (See Comments)  . Penicillins Rash   No current facility-administered medications on file prior to encounter.   Current Outpatient Medications on File Prior to Encounter  Medication Sig Dispense Refill  . AUROVELA FE 1.5/30 1.5-30 MG-MCG tablet Take 1 tablet by mouth daily.    Marland Kitchen lisinopril (ZESTRIL) 10 MG tablet Take 10-20 mg by mouth daily.    . Norethin Ace-Eth Estrad-FE (GILDESS FE 1.5/30 PO) Take by mouth daily.      . pantoprazole (PROTONIX) 40 MG tablet TAKE 1 TABLET TWICE A DAY  BEFORE MEALS 180 tablet 0  . sertraline (ZOLOFT) 100 MG tablet Take 100 mg by mouth daily.      . valACYclovir (VALTREX) 500 MG tablet 500 mg as needed.       . vitamin B-12 (CYANOCOBALAMIN) 1000 MCG tablet Take 1,000 mcg by mouth 2 (two) times daily.    . Vitamin D, Ergocalciferol, (DRISDOL) 1.25 MG (50000 UNIT) CAPS capsule Take 50,000 Units by mouth once a week.     Social History   Socioeconomic History  . Marital status: Married    Spouse name: Not on file  . Number of children: Not on file  . Years of education: Not on file  . Highest education level: Not on file  Occupational History  . Occupation: Chief Technology Officer   Tobacco Use  . Smoking status: Never Smoker  . Smokeless tobacco: Never Used  Vaping Use  . Vaping Use: Never used  Substance and Sexual Activity  . Alcohol use: No    Alcohol/week: 0.0 standard drinks  . Drug use: Not on file  . Sexual activity: Yes  Other Topics Concern  . Not on file  Social History Narrative  . Not on file   Social Determinants of Health   Financial Resource Strain:   . Difficulty of Paying Living Expenses: Not on file  Food Insecurity:   . Worried About Programme researcher, broadcasting/film/video in the Last Year: Not on file  .  Ran Out of Food in the Last Year: Not on file  Transportation Needs:   . Lack of Transportation (Medical): Not on file  . Lack of Transportation (Non-Medical): Not on file  Physical Activity:   . Days of Exercise per Week: Not on file  . Minutes of Exercise per Session: Not on file  Stress:   . Feeling of Stress : Not on file  Social Connections:   . Frequency of Communication with Friends and Family: Not on file  . Frequency of Social Gatherings with Friends and Family: Not on file  . Attends Religious Services: Not on file  . Active Member of Clubs or Organizations: Not on file  . Attends Banker Meetings: Not on file  . Marital Status: Not on file  Intimate Partner Violence:   . Fear of Current or Ex-Partner: Not on file  . Emotionally Abused: Not on file  . Physically Abused: Not on file  . Sexually Abused: Not on file   Family History   Problem Relation Age of Onset  . Breast cancer Mother   . Hypertension Father   . Diabetes Maternal Uncle   . Stroke Maternal Uncle   . Cancer Other        family history   . Diabetes Other        family history   . Arthritis Other        family history     OBJECTIVE:  Vitals:   08/30/20 1700 08/30/20 1701  BP:  (!) 133/91  Pulse:  91  Resp:  18  Temp:  98.3 F (36.8 C)  TempSrc:  Oral  SpO2:  96%  Weight: 187 lb (84.8 kg)   Height: 5\' 2"  (1.575 m)      Physical Exam Vitals and nursing note reviewed.  Constitutional:      General: She is not in acute distress.    Appearance: Normal appearance. She is normal weight. She is not ill-appearing, toxic-appearing or diaphoretic.  HENT:     Head: Normocephalic.  Cardiovascular:     Rate and Rhythm: Normal rate and regular rhythm.     Pulses: Normal pulses.     Heart sounds: Normal heart sounds. No murmur heard.  No friction rub. No gallop.   Pulmonary:     Effort: Pulmonary effort is normal. No respiratory distress.     Breath sounds: Normal breath sounds. No stridor. No wheezing, rhonchi or rales.  Chest:     Chest wall: No tenderness.  Musculoskeletal:        General: Tenderness present.     Lumbar back: Spasms and tenderness present.     Comments: Back:  Patient ambulates from chair to exam table without difficulty.  Inspection: Skin clear and intact without obvious swelling, erythema, or ecchymosis. Warm to the touch  Palpation: Vertebral processes nontender. Tenderness about the lower left paravertebral muscles  ROM: FROM Strength: 5/5 hip flexion, 5/5 knee extension, 5/5 knee flexion, 5/5 plantar flexion, 5/5 dorsiflexion  Special Tests: Negative Straight leg raise  Neurological:     Mental Status: She is alert and oriented to person, place, and time.     LABS:  No results found for this or any previous visit (from the past 24 hour(s)).   ASSESSMENT & PLAN:  1. Acute left-sided low back pain without  sciatica     Meds ordered this encounter  Medications  . cyclobenzaprine (FLEXERIL) 5 MG tablet    Sig: Take 1 tablet (5 mg  total) by mouth 3 (three) times daily as needed.    Dispense:  30 tablet    Refill:  0  . predniSONE (DELTASONE) 10 MG tablet    Sig: Take 2 tablets (20 mg total) by mouth daily.    Dispense:  15 tablet    Refill:  0  . methylPREDNISolone sodium succinate (SOLU-MEDROL) 125 mg/2 mL injection 80 mg   Discharge instructions  Rest, ice and heat as needed Ensure adequate ROM as tolerated. Continue Tylenol as needed for pain  Prescribed prednisone for inflammation prescribed flexeril  for muscle spasm.  Do not drive or operate heavy machinery while taking this medication Return here or go to ER if you have any new or worsening symptoms such as numbness/tingling of the inner thighs, loss of bladder or bowel control, headache/blurry vision, nausea/vomiting, confusion/altered mental status, dizziness, weakness, passing out, imbalance, etc...    Reviewed expectations re: course of current medical issues. Questions answered. Outlined signs and symptoms indicating need for more acute intervention. Patient verbalized understanding. After Visit Summary given.      Note: This document was prepared using Dragon voice recognition software and may include unintentional dictation errors.    Durward Parcel, FNP 08/30/20 1739

## 2020-08-30 NOTE — ED Triage Notes (Addendum)
Low back pain since Saturday night, she bent over and heard something pop.

## 2020-09-14 ENCOUNTER — Ambulatory Visit: Payer: BC Managed Care – PPO | Admitting: Allergy & Immunology

## 2020-09-21 ENCOUNTER — Ambulatory Visit: Payer: BC Managed Care – PPO | Admitting: Allergy & Immunology

## 2020-10-24 DIAGNOSIS — Z8616 Personal history of COVID-19: Secondary | ICD-10-CM

## 2020-10-24 HISTORY — DX: Personal history of COVID-19: Z86.16

## 2020-11-28 ENCOUNTER — Encounter (HOSPITAL_COMMUNITY): Payer: Self-pay | Admitting: *Deleted

## 2020-11-28 ENCOUNTER — Emergency Department (HOSPITAL_COMMUNITY)
Admission: EM | Admit: 2020-11-28 | Discharge: 2020-11-28 | Disposition: A | Discharge status: Home / Self Care | Payer: BC Managed Care – PPO | Attending: Emergency Medicine | Admitting: Emergency Medicine

## 2020-11-28 ENCOUNTER — Emergency Department (HOSPITAL_COMMUNITY): Payer: BC Managed Care – PPO

## 2020-11-28 ENCOUNTER — Other Ambulatory Visit: Payer: Self-pay

## 2020-11-28 DIAGNOSIS — R1012 Left upper quadrant pain: Secondary | ICD-10-CM | POA: Insufficient documentation

## 2020-11-28 DIAGNOSIS — U071 COVID-19: Secondary | ICD-10-CM | POA: Diagnosis not present

## 2020-11-28 DIAGNOSIS — R42 Dizziness and giddiness: Secondary | ICD-10-CM | POA: Diagnosis present

## 2020-11-28 LAB — COMPREHENSIVE METABOLIC PANEL
ALT: 38 U/L (ref 0–44)
AST: 45 U/L — ABNORMAL HIGH (ref 15–41)
Albumin: 4.2 g/dL (ref 3.5–5.0)
Alkaline Phosphatase: 55 U/L (ref 38–126)
Anion gap: 12 (ref 5–15)
BUN: 7 mg/dL (ref 6–20)
CO2: 24 mmol/L (ref 22–32)
Calcium: 8.9 mg/dL (ref 8.9–10.3)
Chloride: 100 mmol/L (ref 98–111)
Creatinine, Ser: 0.54 mg/dL (ref 0.44–1.00)
GFR, Estimated: >60 mL/min (ref >60)
Glucose, Bld: 83 mg/dL (ref 70–99)
Potassium: 3.4 mmol/L — ABNORMAL LOW (ref 3.5–5.1)
Sodium: 136 mmol/L (ref 135–145)
Total Bilirubin: 1 mg/dL (ref 0.3–1.2)
Total Protein: 7.9 g/dL (ref 6.5–8.1)

## 2020-11-28 LAB — RESP PANEL BY RT-PCR (FLU A&B, COVID) ARPGX2
Influenza A by PCR: NEGATIVE
Influenza B by PCR: NEGATIVE
SARS Coronavirus 2 by RT PCR: POSITIVE — AB

## 2020-11-28 LAB — CBC WITH DIFFERENTIAL/PLATELET
Abs Immature Granulocytes: 0 10*3/uL (ref 0.00–0.07)
Basophils Absolute: 0 10*3/uL (ref 0.0–0.1)
Basophils Relative: 0 %
Eosinophils Absolute: 0 10*3/uL (ref 0.0–0.5)
Eosinophils Relative: 0 %
HCT: 44.4 % (ref 36.0–46.0)
Hemoglobin: 14.4 g/dL (ref 12.0–15.0)
Immature Granulocytes: 0 %
Lymphocytes Relative: 26 %
Lymphs Abs: 0.9 10*3/uL (ref 0.7–4.0)
MCH: 28.8 pg (ref 26.0–34.0)
MCHC: 32.4 g/dL (ref 30.0–36.0)
MCV: 88.8 fL (ref 80.0–100.0)
Monocytes Absolute: 0.3 10*3/uL (ref 0.1–1.0)
Monocytes Relative: 8 %
Neutro Abs: 2.2 10*3/uL (ref 1.7–7.7)
Neutrophils Relative %: 66 %
Platelets: 204 10*3/uL (ref 150–400)
RBC: 5 MIL/uL (ref 3.87–5.11)
RDW: 14.5 % (ref 11.5–15.5)
WBC: 3.4 10*3/uL — ABNORMAL LOW (ref 4.0–10.5)
nRBC: 0 % (ref 0.0–0.2)

## 2020-11-28 LAB — LIPASE, BLOOD: Lipase: 29 U/L (ref 11–51)

## 2020-11-28 MED ORDER — SODIUM CHLORIDE 0.9 % IV SOLN: INTRAVENOUS | Status: DC | PRN

## 2020-11-28 MED ORDER — EPINEPHRINE 0.3 MG/0.3ML IJ SOAJ: 0.3000 mg | Freq: Once | INTRAMUSCULAR | Status: DC | PRN

## 2020-11-28 MED ORDER — DIPHENHYDRAMINE HCL 50 MG/ML IJ SOLN: 50.0000 mg | Freq: Once | INTRAMUSCULAR | Status: DC | PRN

## 2020-11-28 MED ORDER — SODIUM CHLORIDE 0.9 % IV BOLUS
1000.0000 mL | Freq: Once | INTRAVENOUS | Status: AC
  Administered 2020-11-28: 1000 mL via INTRAVENOUS

## 2020-11-28 MED ORDER — METHYLPREDNISOLONE SODIUM SUCC 125 MG IJ SOLR: 125.0000 mg | Freq: Once | INTRAMUSCULAR | Status: DC | PRN

## 2020-11-28 MED ORDER — ALBUTEROL SULFATE HFA 108 (90 BASE) MCG/ACT IN AERS: 2.0000 | INHALATION_SPRAY | Freq: Once | RESPIRATORY_TRACT | Status: DC | PRN

## 2020-11-28 MED ORDER — SODIUM CHLORIDE 0.9 % IV SOLN
1200.0000 mg | Freq: Once | INTRAVENOUS | Status: AC
  Administered 2020-11-28: 1200 mg via INTRAVENOUS
  Filled 2020-11-28: qty 10

## 2020-11-28 MED ORDER — ONDANSETRON HCL 4 MG/2ML IJ SOLN
4.0000 mg | Freq: Once | INTRAMUSCULAR | Status: AC
  Administered 2020-11-28: 4 mg via INTRAVENOUS
  Filled 2020-11-28: qty 2

## 2020-11-28 MED ORDER — FAMOTIDINE IN NACL 20-0.9 MG/50ML-% IV SOLN: 20.0000 mg | Freq: Once | INTRAVENOUS | Status: DC | PRN

## 2020-11-28 NOTE — ED Notes (Addendum)
Entered room and introduced self to patient. Pt appears to be resting in bed, respirations are even and unlabored with equal chest rise and fall. Bed is locked in the lowest position, side rails x2, call bell within reach. Pt educated on call light use and hourly rounding, verbalized understanding and in agreement at this time. All questions and concerns voiced addressed. Refreshments offered and provided per patient request. Cardiac monitor in place with vital signs cycling q4 hours per policy. Will continue to monitor.

## 2020-11-28 NOTE — ED Notes (Signed)
ED Provider at bedside. 

## 2020-11-28 NOTE — ED Notes (Signed)
CRITICAL VALUE ALERT  Critical Value:  COVID +  Date & Time Notied:  11/28/2020 1652  Provider Notified: Dr. Jeraldine Loots  Orders Received/Actions taken: see chart

## 2020-11-28 NOTE — Discharge Instructions (Signed)
Your lab tests, exam and chest xray are reassuring today and you have received the monoclonal antibody infusion which should help you get over this Covid 19 infection easier.  Rest and make sure you are drinking plenty of fluids.  It will be important for you to maintain home quarantine for a full 10 days from the onset of your symptoms.  However, get rechecked by your primary MD or return here if you develop shortness of breath or significantly worsening weakness.

## 2020-11-28 NOTE — ED Notes (Signed)
Pt provided with crackers and a ginger ale per request at this time. Pt currently tolerating PO intake without distress.

## 2020-11-28 NOTE — ED Notes (Signed)
CRITICAL VALUE ALERT  Critical Value:  COVID +  Date & Time Notied:  11/28/2020 1654  Provider Notified: Dr. Jeraldine Loots  Orders Received/Actions taken: see chat

## 2020-11-28 NOTE — ED Notes (Signed)
X Ray at bedside at this time.  

## 2020-11-28 NOTE — ED Provider Notes (Signed)
  8 PM: Checked on the patient.  Her MAB infusion finished as we were talking.  She states she feels much better.  She notes she was experiencing some lightheadedness earlier, but no longer.  She does not have shortness of breath, chest pain, or other complaints.  No increased work of breathing.  Speaks in full sentences without difficulty.  Manual pulse rate around 96.  SPO2 100% on room air.   Concepcion Living 11/28/20 2010    Gerhard Munch, MD 11/28/20 2127

## 2020-11-28 NOTE — Progress Notes (Signed)
Pharmacy COVID-19 Monoclonal Antibody Screening  Jaime Hughes was identified as being not hospitalized with symptoms from Covid-19 on admission but an incidental positive PCR has been documented.  The patient may qualify for the use of monoclonal antibodies (mAB) for COVID-19 viral infection to prevent worsening symptoms stemming from Covid-19 infection.  The patient was identified based on a positive COVID-19 PCR and not requiring the use of supplemental oxygen at this time.  This patient meets the FDA criteria for Emergency Use Authorization of casirivimab/imdevimab or bamlanivimab/etesevimab.  Has a (+) direct SARS-CoV-2 viral test result  Is NOT hospitalized due to COVID-19  Is within 10 days of symptom onset  Has at least one of the high risk factor(s) for progression to severe COVID-19 and/or hospitalization as defined in EUA.  Specific high risk criteria : BMI > 25  Additionally:  The patient has not had a positive COVID-19 PCR in the last 90 days.  The patient is unvaccinated against COVID-19.  Since the patient is unvaccinated and meets high risk criteria, the patient is eligible for mAB administration.   This eligibility and indication for treatment was discussed with the patient's physician: Burgess Amor, PA  Plan: Based on the above discussion, it was decided that the patient will receive one dose of the available COVID-19 mAB combination. Pharmacy will coordinate administration timing with patient's nurse. Recommended infusion monitoring parameters communicated to the nursing team.  Ulyses Southward, PharmD, BCIDP, AAHIVP, CPP Infectious Disease Pharmacist 11/28/2020 6:05 PM

## 2020-11-28 NOTE — ED Provider Notes (Signed)
[facility] EMERGENCY DEPARTMENT Provider Note   CSN: [id number] Arrival date & time: 11/28/20  1046     History Chief Complaint  Patient presents with  . Weakness    Jaime Hughes is a 39 y.o. female with a history of migraines, acid reflux who was diagnosed with covid 19 via home test presenting with complaints of lightheadedness, generalized fatigue, subjective fever, weakness with nausea at any attempt to eat or drink.  Her generalized body aches and fever started 8 days ago.  She feels dehydrated, also reports having 7-8 nonbloody diarrheal episodes yesterday, none today.  She also has lost taste and smell.  Cough has been non productive, denies chest pain or sob.  She had LLQ abdominal pain yesterday, which is milder today, but localizing to the LUQ currently.  No flank pain, no dysuria, normal frequency of urination.  She has taken theraflu for sx relief.   Of note, her husband was diagnosed with Covid 19 here on 11/29, received the MAB infusion while here. Pt is desirous of having this treatment today.  She is not vaccinated for Covid.  HPI     Past Medical History:  Diagnosis Date  . Anxiety   . Depression   . Eczema   . Heartburn   . Migraines   . Recurrent upper respiratory infection (URI)   . Reflux     Patient Active Problem List   Diagnosis Date Noted  . Seasonal and perennial allergic rhinitis 06/17/2020  . Anaphylactic shock due to adverse food reaction 06/17/2020  . B12 deficiency 06/29/2014  . Internal hemorrhoids with prolapse,pain, and bleeding 01/22/2014  . External hemorrhoids with pain & irritation 01/22/2014  . Abdominal pain, right upper quadrant 10/14/2012  . GERD (gastroesophageal reflux disease) 11/27/2011  . History of migraine headaches 11/27/2011  . 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 TONSILLECTOMY    . UPPER GASTROINTESTINAL ENDOSCOPY    . WISDOM TOOTH EXTRACTION  01/2009   x2     OB  History   No obstetric history on file.     Family History  Problem Relation Age of Onset  . Breast cancer Mother   . Hypertension Father   . Diabetes Maternal Uncle   . Stroke Maternal Uncle   . Cancer Other        family history   . Diabetes Other        family history   . Arthritis Other        family history     Social History   Tobacco Use  . Smoking status: Never Smoker  . Smokeless tobacco: Never Used  Vaping Use  . Vaping Use: Never used  Substance Use Topics  . Alcohol use: No    Alcohol/week: 0.0 standard drinks  . Drug use: Not on file    Home Medications Prior to Admission medications   Medication Sig Start Date End Date Taking? Authorizing Provider  AUROVELA FE 1.5/30 1.5-30 MG-MCG tablet Take 1 tablet by mouth daily. 04/05/20  Yes [provider]  cyclobenzaprine (FLEXERIL) 5 MG tablet Take 1 tablet (5 mg total) by mouth 3 (three) times daily as needed. 08/30/20  Yes Avegno, Zachery Dakins, FNP  lisinopril (ZESTRIL) 10 MG tablet Take 10 mg by mouth 2 (two) times daily at 8 am and 10 pm.  06/07/20  Yes [provider]  pantoprazole (PROTONIX) 40 MG tablet TAKE 1 TABLET TWICE A DAY  BEFORE MEALS Patient  taking differently: Take 40 mg by mouth 2 (two) times daily. TAKE 1 TABLET TWICE A DAY BEFORE MEALS 11/27/18  Yes Rehman, Joline Maxcy, MD  Phenylephrine-Pheniramine-DM [facility] COLD & COUGH PO) Take 1 tablet by mouth daily as needed.   Yes [provider]  sertraline (ZOLOFT) 100 MG tablet Take 100 mg by mouth daily.     Yes [provider]  valACYclovir (VALTREX) 500 MG tablet Take 500 mg by mouth as needed.  11/19/11  Yes [provider]  vitamin B-12 (CYANOCOBALAMIN) 1000 MCG tablet Take 1,000 mcg by mouth daily.    Yes [provider]  Vitamin D, Ergocalciferol, (DRISDOL) 1.25 MG (50000 UNIT) CAPS capsule Take 50,000 Units by mouth once a week. 05/02/20  Yes [provider]  predniSONE (DELTASONE) 10 MG  tablet Take 2 tablets (20 mg total) by mouth daily. Patient not taking: Reported on 11/28/2020 08/30/20   Durward Parcel, FNP    Allergies    Amoxicillin-pot clavulanate, Sesame seed (diagnostic), Justicia adhatoda (malabar nut tree) [justicia adhatoda], Codeine, Sulfa antibiotics, Sulfamethoxazole, Sulfonamide derivatives, Tape, and Penicillins  Review of Systems   Review of Systems  Constitutional: Positive for fatigue. Negative for fever.  HENT: Negative for congestion and sore throat.   Eyes: Negative.   Respiratory: Negative for chest tightness and shortness of breath.   Cardiovascular: Negative for chest pain.  Gastrointestinal: Positive for abdominal pain, diarrhea and nausea. Negative for vomiting.  Genitourinary: Negative.  Negative for decreased urine volume and dysuria.  Musculoskeletal: Negative for arthralgias, joint swelling and neck pain.  Skin: Negative.  Negative for rash and wound.  Neurological: Positive for light-headedness. Negative for dizziness, weakness, numbness and headaches.  Psychiatric/Behavioral: Negative.     Physical Exam Updated Vital Signs BP 123/78 (BP Location: Right Arm)   Pulse (!) 108   Temp 99.6 F (37.6 C) (Oral)   Resp 19   Ht 5\' 2"  (1.575 m)   Wt 83.9 kg   LMP 11/07/2020   SpO2 96%   BMI 33.84 kg/m   Physical Exam Vitals and nursing note reviewed.  Constitutional:      Appearance: She is well-developed.  HENT:     Head: Normocephalic and atraumatic.     Mouth/Throat:     Mouth: Mucous membranes are dry.     Pharynx: No oropharyngeal exudate or posterior oropharyngeal erythema.  Eyes:     Conjunctiva/sclera: Conjunctivae normal.  Cardiovascular:     Rate and Rhythm: Normal rate and regular rhythm.     Heart sounds: Normal heart sounds.  Pulmonary:     Effort: Pulmonary effort is normal.     Breath sounds: Normal breath sounds. No wheezing.  Abdominal:     General: Bowel sounds are normal. There is no distension.      Palpations: Abdomen is soft.     Tenderness: There is abdominal tenderness. There is no guarding.     Comments: Mild LUQ discomfort with deep palpation, no guarding.  No acute abd findings.  Musculoskeletal:        General: Normal range of motion.     Cervical back: Normal range of motion.  Skin:    General: Skin is warm and dry.  Neurological:     Mental Status: She is alert.     ED Results / Procedures / Treatments   Labs (all labs ordered are listed, but only abnormal results are displayed) Labs Reviewed  RESP PANEL BY RT-PCR (FLU A&B, COVID) ARPGX2 - Abnormal; Notable for  the following components:      Result Value   SARS Coronavirus 2 by RT PCR POSITIVE (*)    All other components within normal limits  CBC WITH DIFFERENTIAL/PLATELET - Abnormal; Notable for the following components:   WBC 3.4 (*)    All other components within normal limits  COMPREHENSIVE METABOLIC PANEL - Abnormal; Notable for the following components:   Potassium 3.4 (*)    AST 45 (*)    All other components within normal limits  LIPASE, BLOOD  URINALYSIS, ROUTINE W REFLEX MICROSCOPIC  PREGNANCY, URINE    EKG None  Radiology DG Chest Portable 1 View  Result Date: 11/28/2020 CLINICAL DATA:  COVID 19 infection EXAM: PORTABLE CHEST 1 VIEW COMPARISON:  June 21, 2015 FINDINGS: Trachea midline. Cardiomediastinal contours and hilar structures are normal. Linear opacity at the LEFT lung base. Lungs are otherwise clear. No sign of consolidation. No pleural effusion. On limited assessment no acute skeletal process. IMPRESSION: Linear opacity at the LEFT lung base favored to represent atelectasis or scarring. No sign of consolidation or effusion. Electronically Signed   By: Donzetta Kohut M.D.   On: 11/28/2020 15:00    Procedures Procedures (including critical care time)  Medications Ordered in ED Medications  casirivimab-imdevimab (REGEN-COV) 1,200 mg in sodium chloride 0.9 % 110 mL IVPB (has no  administration in time range)  0.9 %  sodium chloride infusion (has no administration in time range)  diphenhydrAMINE (BENADRYL) injection 50 mg (has no administration in time range)  famotidine (PEPCID) IVPB 20 mg premix (has no administration in time range)  methylPREDNISolone sodium succinate (SOLU-MEDROL) 125 mg/2 mL injection 125 mg (has no administration in time range)  albuterol (VENTOLIN HFA) 108 (90 Base) MCG/ACT inhaler 2 puff (has no administration in time range)  EPINEPHrine (EPI-PEN) injection 0.3 mg (has no administration in time range)  sodium chloride 0.9 % bolus 1,000 mL (1,000 mLs Intravenous New Bag/Given 11/28/20 1521)  ondansetron (ZOFRAN) injection 4 mg (4 mg Intravenous Given 11/28/20 1531)    ED Course  I have reviewed the triage vital signs and the nursing notes.  Pertinent labs & imaging results that were available during my care of the patient were reviewed by me and considered in my medical decision making (see chart for details).    MDM Rules/Calculators/A&P                          Pt with Covid 19 infection, labs and cxr reviewed and discussed with pt, stable, no covid pneumonia.  She is currently on day 8 of her illness and would benefit from MAB treatment.  Ordered here given concern for being able to get this as an outpatient before the 10 day limit.  Will plan f/u supportive home care.  No wheezing, no sob, no hypoxia while here.  Return precautions outlined.    Jaime Hughes was evaluated in Emergency Department on 11/28/2020 for the symptoms described in the history of present illness. She was evaluated in the context of the global COVID-19 pandemic, which necessitated consideration that the patient might be at risk for infection with the SARS-CoV-2 virus that causes COVID-19. Institutional protocols and algorithms that pertain to the evaluation of patients at risk for COVID-19 are in a state of rapid change based on information released by regulatory  bodies including the CDC and federal and state organizations. These policies and algorithms were followed during the patient's care in the ED.  Final Clinical  Impression(s) / ED Diagnoses Final diagnoses:  COVID-19    Rx / DC Orders ED Discharge Orders    None       Victoriano Laindol, [place] Catarino, PA-C 11/28/20 1839    Vanetta MuldersZackowski, Scott, MD 11/29/20 1536

## 2020-11-28 NOTE — ED Triage Notes (Signed)
COVID + 12/1, states dizziness & headache that have increased in severity in the last 2 days. States she feels dehydrated due to inabilty to eat/drink r/t losing since of smell/taste.  98% RA in tiage, NAD.

## 2021-01-24 HISTORY — PX: UPPER GASTROINTESTINAL ENDOSCOPY: SHX188

## 2021-01-24 HISTORY — PX: OTHER SURGICAL HISTORY: SHX169

## 2022-01-26 ENCOUNTER — Other Ambulatory Visit (HOSPITAL_COMMUNITY): Payer: Self-pay | Admitting: Gastroenterology

## 2022-01-26 DIAGNOSIS — R1031 Right lower quadrant pain: Secondary | ICD-10-CM

## 2022-02-06 ENCOUNTER — Other Ambulatory Visit: Payer: Self-pay

## 2022-02-06 ENCOUNTER — Ambulatory Visit (HOSPITAL_COMMUNITY)
Admission: RE | Admit: 2022-02-06 | Discharge: 2022-02-06 | Disposition: A | Discharge status: Home / Self Care | Payer: BC Managed Care – PPO | Source: Ambulatory Visit | Attending: Gastroenterology | Admitting: Gastroenterology

## 2022-02-06 DIAGNOSIS — R1031 Right lower quadrant pain: Secondary | ICD-10-CM | POA: Insufficient documentation

## 2022-02-06 MED ORDER — STERILE WATER FOR INJECTION IJ SOLN
INTRAMUSCULAR | Status: AC
  Administered 2022-02-06: 1.73 mL
  Filled 2022-02-06: qty 10

## 2022-02-06 MED ORDER — TECHNETIUM TC 99M MEBROFENIN IV KIT
5.0000 | PACK | Freq: Once | INTRAVENOUS | Status: AC | PRN
  Administered 2022-02-06: 5.2 via INTRAVENOUS

## 2022-02-06 MED ORDER — SODIUM CHLORIDE FLUSH 0.9 % IV SOLN
INTRAVENOUS | Status: AC
  Filled 2022-02-06: qty 80

## 2022-02-06 MED ORDER — SINCALIDE 5 MCG IJ SOLR
INTRAMUSCULAR | Status: AC
  Administered 2022-02-06: 1.73 ug
  Filled 2022-02-06: qty 5

## 2022-04-16 ENCOUNTER — Ambulatory Visit: Payer: Self-pay | Admitting: General Surgery

## 2022-04-16 NOTE — H&P (Signed)
?REFERRING PHYSICIAN:  Eleanora Neighbor, MD ?  ?PROVIDER:  Elenora Gamma, MD ?  ?MRN: [id number] ?DOB: [birth year] ?DATE OF ENCOUNTER: 04/16/2022 ?  ?Subjective  ?  ?Chief Complaint: Anal problems ?  ?  ?  ?History of Present Illness: ?Jaime Hughes is a 41 y.o. female who is seen today as an office consultation at the request of Dr. Cloretta Ned for evaluation of Anal problems ?Marland Kitchen   ?Pt has large anal skin tags.  They have been present for many years.  She attributes them to a history of constipation.  She states that that is much better now and she has regular soft bowel movements on a daily basis.  She denies any rectal bleeding.  She has had a colonoscopy within the last few months.  Patient states she commonly has trouble with inflammation and pain especially with wiping.  She feels that she cannot get the area clean.  She denies any straining. ?  ?  ?Review of Systems: ?A complete review of systems was obtained from the patient.  I have reviewed this information and discussed as appropriate with the patient.  See HPI as well for other ROS. ?  ?  ?Medical History: ?Past Medical History  ?    ?Past Medical History:  ?Diagnosis Date  ? Anxiety    ? Arthritis    ? GERD (gastroesophageal reflux disease)    ? Hypertension    ? Sleep apnea    ?  ?  ?  ?There is no problem list on file for this patient. ?  ?  ?Past Surgical History  ?     ?Past Surgical History:  ?Procedure Laterality Date  ? TONSILLECTOMY      ?  2010  ? ulner nerve Right    ?  2017 and 2019  ?  ?  ?  ?Allergies  ?     ?Allergies  ?Allergen Reactions  ? Penicillins Hives and Rash  ? Codeine Nausea And Vomiting, Swelling and Vomiting  ?    hives ?hives ?   ? Sulfa (Sulfonamide Antibiotics) Other (See Comments), Vomiting and Nausea And Vomiting  ?    rash ?rash ?   ?  ?  ?  ?      ?Current Outpatient Medications on File Prior to Visit  ?Medication Sig Dispense Refill  ? lisinopriL (ZESTRIL) 10 MG tablet Take by mouth      ? cyanocobalamin (VITAMIN  B12) 1000 MCG tablet Take by mouth      ? norethindrone-ethinyl estradiol-iron (JUNEL FE 1.5/30, 28,) 1.5 mg-30 mcg (21)/75 mg (7) tablet Junel FE 1.5/30 (28) 1.5 mg-30 mcg (21)/75 mg (7) tablet ? TAKE 1 TABLET BY MOUTH DAILY      ? pantoprazole (PROTONIX) 40 MG DR tablet Take 40 mg by mouth once daily      ? sertraline (ZOLOFT) 100 MG tablet sertraline 100 mg tablet ? TAKE 1 TABLET BY MOUTH IN THE EVENING      ? zonisamide (ZONEGRAN) 100 MG capsule Take 100 mg by mouth once daily      ?  ?No current facility-administered medications on file prior to visit.  ?  ?  ?Family History  ?     ?Family History  ?Problem Relation Age of Onset  ? Breast cancer Mother    ? High blood pressure (Hypertension) Father    ?  ?  ?  ?Social History  ?  ?   ?Tobacco Use  ?Smoking Status Never  ?  Smokeless Tobacco Never  ?  ?  ?Social History  ?Social History  ?  ?    ?Socioeconomic History  ? Marital status: Married  ?Tobacco Use  ? Smoking status: Never  ? Smokeless tobacco: Never  ?Vaping Use  ? Vaping Use: Never used  ?Substance and Sexual Activity  ? Alcohol use: Never  ? Drug use: Never  ?  ?  ?  ?Objective:  ?  ?  ?   ?Vitals:  ?  04/16/22 1444  ?BP: 120/74  ?Pulse: 102  ?Temp: 36.6 ?C (97.9 ?F)  ?SpO2: 97%  ?Weight: 89.4 kg (197 lb)  ?Height: 157.5 cm (5\' 2" )  ?  ?  ?Exam ?Gen: NAD ?Abd: soft ?CV: RRR ?Lungs: CTA ?Rectal: Multiple skin tags noted at all 3 hemorrhoid positions. ?  ?  ?Labs, Imaging and Diagnostic Testing: ?  ?Procedure: Anoscopy ?Surgeon: ?After the risks and benefits were explained, written consent was obtained for above procedure.  A medical assistant chaperone was present thoroughout the entire procedure.  ?Anesthesia: none ?Diagnosis: Hemorrhoids ?Findings: Multiple skin tags noted externally.  No internal hemorrhoids noted. ?  ?  ?Assessment and Plan:  ?Diagnoses and all orders for this visit: ?  ?Skin tag of anus ?  ?  ?Patient has external skin tags related to what appears to be previous  hemorrhoidal inflammation.  She is currently controlling her bowel movements and denies any problems with constipation.  Despite this, she continues to have problems with irritation and swelling as well as hygiene.  We discussed proceeding with a hemorrhoidectomy.  We discussed the typical postoperative pain associated with this.  We discussed the typical healing time.  We discussed the risk of recurrence of her skin tags.  We discussed the typical time needed to be off work (approximately 2 weeks).  We discussed the risk of stricture and bleeding.  We discussed the need for multimodality pain control postoperatively.  All questions were answered.  Patient would like to proceed with surgery. ?

## 2022-06-01 ENCOUNTER — Encounter (HOSPITAL_BASED_OUTPATIENT_CLINIC_OR_DEPARTMENT_OTHER): Payer: Self-pay | Admitting: General Surgery

## 2022-06-01 ENCOUNTER — Other Ambulatory Visit: Payer: Self-pay

## 2022-06-01 NOTE — Progress Notes (Signed)
Spoke w/ via phone for pre-op interview---pt Lab needs dos----    I stat, elg, urine preg poct           Lab results------none COVID test -----patient states asymptomatic no test needed Arrive at -------530 am 06-07-2022 NPO after MN NO Solid Food.  Clear liquids from MN until---430 am Med rec completed Medications to take morning of surgery -----none Diabetic medication -----n/a Patient instructed no nail polish to be worn day of surgery Patient instructed to bring photo id and insurance card day of surgery Patient aware to have Driver (ride ) / caregiver  husband joey and father   for 24 hours after surgery  Patient Special Instructions -----none Pre-Op special Istructions -----pt has nose piercing will put in spacer dos Patient verbalized understanding of instructions that were given at this phone interview. Patient denies shortness of breath, chest pain, fever, cough at this phone interview.

## 2022-06-06 NOTE — Anesthesia Preprocedure Evaluation (Addendum)
Anesthesia Evaluation  Patient identified by MRN, date of birth, ID band Patient awake    Reviewed: Allergy & Precautions, NPO status , Patient's Chart, lab work & pertinent test results  History of Anesthesia Complications (+) PONV and history of anesthetic complications  Airway Mallampati: II  TM Distance: >3 FB Neck ROM: Full    Dental no notable dental hx.    Pulmonary sleep apnea ,    Pulmonary exam normal        Cardiovascular hypertension, Pt. on medications Normal cardiovascular exam     Neuro/Psych  Headaches, Anxiety Depression    GI/Hepatic Neg liver ROS, GERD  Medicated and Controlled,  Endo/Other  negative endocrine ROS  Renal/GU negative Renal ROS  negative genitourinary   Musculoskeletal  (+) Arthritis ,   Abdominal   Peds  Hematology negative hematology ROS (+)   Anesthesia Other Findings Day of surgery medications reviewed with patient.  Reproductive/Obstetrics negative OB ROS                            Anesthesia Physical Anesthesia Plan  ASA: 2  Anesthesia Plan: MAC   Post-op Pain Management: Tylenol PO (pre-op)*   Induction:   PONV Risk Score and Plan: 3 and Treatment may vary due to age or medical condition, Propofol infusion, Midazolam and Ondansetron  Airway Management Planned: Natural Airway and Nasal Cannula  Additional Equipment: None  Intra-op Plan:   Post-operative Plan:   Informed Consent: I have reviewed the patients History and Physical, chart, labs and discussed the procedure including the risks, benefits and alternatives for the proposed anesthesia with the patient or authorized representative who has indicated his/her understanding and acceptance.       Plan Discussed with: CRNA  Anesthesia Plan Comments:        Anesthesia Quick Evaluation

## 2022-06-07 ENCOUNTER — Ambulatory Visit (HOSPITAL_BASED_OUTPATIENT_CLINIC_OR_DEPARTMENT_OTHER)
Admission: RE | Admit: 2022-06-07 | Discharge: 2022-06-07 | Disposition: A | Discharge status: Home / Self Care | Payer: BC Managed Care – PPO | Source: Ambulatory Visit | Attending: General Surgery | Admitting: General Surgery

## 2022-06-07 ENCOUNTER — Encounter (HOSPITAL_BASED_OUTPATIENT_CLINIC_OR_DEPARTMENT_OTHER)
Admission: RE | Disposition: A | Discharge status: Home / Self Care | Payer: Self-pay | Source: Ambulatory Visit | Attending: General Surgery

## 2022-06-07 ENCOUNTER — Ambulatory Visit (HOSPITAL_BASED_OUTPATIENT_CLINIC_OR_DEPARTMENT_OTHER): Payer: BC Managed Care – PPO | Admitting: Anesthesiology

## 2022-06-07 ENCOUNTER — Encounter (HOSPITAL_BASED_OUTPATIENT_CLINIC_OR_DEPARTMENT_OTHER): Payer: Self-pay | Admitting: General Surgery

## 2022-06-07 DIAGNOSIS — F32A Depression, unspecified: Secondary | ICD-10-CM | POA: Insufficient documentation

## 2022-06-07 DIAGNOSIS — F419 Anxiety disorder, unspecified: Secondary | ICD-10-CM | POA: Diagnosis not present

## 2022-06-07 DIAGNOSIS — K644 Residual hemorrhoidal skin tags: Secondary | ICD-10-CM | POA: Insufficient documentation

## 2022-06-07 DIAGNOSIS — Z01818 Encounter for other preprocedural examination: Secondary | ICD-10-CM

## 2022-06-07 DIAGNOSIS — K219 Gastro-esophageal reflux disease without esophagitis: Secondary | ICD-10-CM | POA: Diagnosis not present

## 2022-06-07 DIAGNOSIS — G4730 Sleep apnea, unspecified: Secondary | ICD-10-CM | POA: Insufficient documentation

## 2022-06-07 DIAGNOSIS — M1990 Unspecified osteoarthritis, unspecified site: Secondary | ICD-10-CM | POA: Insufficient documentation

## 2022-06-07 DIAGNOSIS — Z79899 Other long term (current) drug therapy: Secondary | ICD-10-CM | POA: Diagnosis not present

## 2022-06-07 DIAGNOSIS — I10 Essential (primary) hypertension: Secondary | ICD-10-CM | POA: Insufficient documentation

## 2022-06-07 HISTORY — DX: Other specified postprocedural states: Z98.890

## 2022-06-07 HISTORY — DX: Acne, unspecified: L70.9

## 2022-06-07 HISTORY — DX: Unspecified osteoarthritis, unspecified site: M19.90

## 2022-06-07 HISTORY — DX: Presence of spectacles and contact lenses: Z97.3

## 2022-06-07 HISTORY — PX: HEMORRHOID SURGERY: SHX153

## 2022-06-07 HISTORY — DX: Nausea with vomiting, unspecified: R11.2

## 2022-06-07 HISTORY — DX: Family history of other specified conditions: Z84.89

## 2022-06-07 HISTORY — DX: Essential (primary) hypertension: I10

## 2022-06-07 HISTORY — DX: Residual hemorrhoidal skin tags: K64.4

## 2022-06-07 HISTORY — DX: Sleep apnea, unspecified: G47.30

## 2022-06-07 LAB — POCT I-STAT, CHEM 8
BUN: 11 mg/dL (ref 6–20)
Calcium, Ion: 1.22 mmol/L (ref 1.15–1.40)
Chloride: 106 mmol/L (ref 98–111)
Creatinine, Ser: 0.5 mg/dL (ref 0.44–1.00)
Glucose, Bld: 105 mg/dL — ABNORMAL HIGH (ref 70–99)
HCT: 42 % (ref 36.0–46.0)
Hemoglobin: 14.3 g/dL (ref 12.0–15.0)
Potassium: 3.6 mmol/L (ref 3.5–5.1)
Sodium: 140 mmol/L (ref 135–145)
TCO2: 21 mmol/L — ABNORMAL LOW (ref 22–32)

## 2022-06-07 LAB — POCT PREGNANCY, URINE: Preg Test, Ur: NEGATIVE

## 2022-06-07 SURGERY — HEMORRHOIDECTOMY
Anesthesia: Monitor Anesthesia Care | Site: Rectum

## 2022-06-07 MED ORDER — ACETAMINOPHEN 325 MG RE SUPP: 650.0000 mg | RECTAL | Status: DC | PRN

## 2022-06-07 MED ORDER — OXYCODONE HCL 5 MG PO TABS: 5.0000 mg | ORAL_TABLET | Freq: Once | ORAL | Status: DC | PRN

## 2022-06-07 MED ORDER — GABAPENTIN 300 MG PO CAPS
ORAL_CAPSULE | ORAL | Status: AC
  Filled 2022-06-07: qty 1

## 2022-06-07 MED ORDER — BUPIVACAINE LIPOSOME 1.3 % IJ SUSP
INTRAMUSCULAR | Status: AC
  Filled 2022-06-07: qty 20

## 2022-06-07 MED ORDER — BUPIVACAINE LIPOSOME 1.3 % IJ SUSP: 20.0000 mL | Freq: Once | INTRAMUSCULAR | Status: DC

## 2022-06-07 MED ORDER — FENTANYL CITRATE (PF) 100 MCG/2ML IJ SOLN: 25.0000 ug | INTRAMUSCULAR | Status: DC | PRN

## 2022-06-07 MED ORDER — PROPOFOL 10 MG/ML IV BOLUS
INTRAVENOUS | Status: DC | PRN
  Administered 2022-06-07: 20 mg via INTRAVENOUS

## 2022-06-07 MED ORDER — FENTANYL CITRATE (PF) 100 MCG/2ML IJ SOLN
INTRAMUSCULAR | Status: AC
  Filled 2022-06-07: qty 2

## 2022-06-07 MED ORDER — GABAPENTIN 300 MG PO CAPS
300.0000 mg | ORAL_CAPSULE | ORAL | Status: AC
  Administered 2022-06-07: 300 mg via ORAL

## 2022-06-07 MED ORDER — ONDANSETRON HCL 4 MG/2ML IJ SOLN
INTRAMUSCULAR | Status: DC | PRN
  Administered 2022-06-07: 4 mg via INTRAVENOUS

## 2022-06-07 MED ORDER — ACETAMINOPHEN 325 MG PO TABS: 650.0000 mg | ORAL_TABLET | ORAL | Status: DC | PRN

## 2022-06-07 MED ORDER — FENTANYL CITRATE (PF) 100 MCG/2ML IJ SOLN
INTRAMUSCULAR | Status: DC | PRN
  Administered 2022-06-07: 50 ug via INTRAVENOUS

## 2022-06-07 MED ORDER — PROPOFOL 10 MG/ML IV BOLUS
INTRAVENOUS | Status: AC
  Filled 2022-06-07: qty 20

## 2022-06-07 MED ORDER — SODIUM CHLORIDE 0.9% FLUSH: 3.0000 mL | Freq: Two times a day (BID) | INTRAVENOUS | Status: DC

## 2022-06-07 MED ORDER — PROPOFOL 500 MG/50ML IV EMUL
INTRAVENOUS | Status: DC | PRN
  Administered 2022-06-07: 200 ug/kg/min via INTRAVENOUS

## 2022-06-07 MED ORDER — OXYCODONE HCL 5 MG PO TABS: 5.0000 mg | ORAL_TABLET | ORAL | Status: DC | PRN

## 2022-06-07 MED ORDER — OXYCODONE HCL 5 MG PO TABS: 5.0000 mg | ORAL_TABLET | Freq: Four times a day (QID) | ORAL | 0 refills | Status: DC | PRN

## 2022-06-07 MED ORDER — PROPOFOL 500 MG/50ML IV EMUL
INTRAVENOUS | Status: AC
  Filled 2022-06-07: qty 50

## 2022-06-07 MED ORDER — MIDAZOLAM HCL 2 MG/2ML IJ SOLN
INTRAMUSCULAR | Status: AC
  Filled 2022-06-07: qty 2

## 2022-06-07 MED ORDER — BUPIVACAINE-EPINEPHRINE 0.5% -1:200000 IJ SOLN
INTRAMUSCULAR | Status: AC
  Filled 2022-06-07: qty 2

## 2022-06-07 MED ORDER — ACETAMINOPHEN 500 MG PO TABS
ORAL_TABLET | ORAL | Status: AC
  Filled 2022-06-07: qty 2

## 2022-06-07 MED ORDER — BUPIVACAINE LIPOSOME 1.3 % IJ SUSP
INTRAMUSCULAR | Status: DC | PRN
  Administered 2022-06-07: 20 mL

## 2022-06-07 MED ORDER — SODIUM CHLORIDE 0.9 % IV SOLN: 250.0000 mL | INTRAVENOUS | Status: DC | PRN

## 2022-06-07 MED ORDER — MIDAZOLAM HCL 5 MG/5ML IJ SOLN
INTRAMUSCULAR | Status: DC | PRN
  Administered 2022-06-07: 2 mg via INTRAVENOUS

## 2022-06-07 MED ORDER — ACETAMINOPHEN 500 MG PO TABS
1000.0000 mg | ORAL_TABLET | ORAL | Status: AC
  Administered 2022-06-07: 1000 mg via ORAL

## 2022-06-07 MED ORDER — OXYCODONE HCL 5 MG/5ML PO SOLN: 5.0000 mg | Freq: Once | ORAL | Status: DC | PRN

## 2022-06-07 MED ORDER — LACTATED RINGERS IV SOLN: INTRAVENOUS | Status: DC

## 2022-06-07 MED ORDER — BUPIVACAINE-EPINEPHRINE 0.5% -1:200000 IJ SOLN
INTRAMUSCULAR | Status: DC | PRN
  Administered 2022-06-07: 30 mL

## 2022-06-07 MED ORDER — SODIUM CHLORIDE 0.9% FLUSH: 3.0000 mL | INTRAVENOUS | Status: DC | PRN

## 2022-06-07 SURGICAL SUPPLY — 38 items
BLADE EXTENDED COATED 6.5IN (ELECTRODE) IMPLANT
BLADE SURG 10 STRL SS (BLADE) IMPLANT
COVER BACK TABLE 60X90IN (DRAPES) ×3 IMPLANT
COVER MAYO STAND STRL (DRAPES) ×3 IMPLANT
DRAPE LAPAROTOMY 100X72 PEDS (DRAPES) ×3 IMPLANT
DRAPE UTILITY XL STRL (DRAPES) ×3 IMPLANT
DRSG PAD ABDOMINAL 8X10 ST (GAUZE/BANDAGES/DRESSINGS) ×3 IMPLANT
ELECT REM PT RETURN 9FT ADLT (ELECTROSURGICAL) ×2
ELECTRODE REM PT RTRN 9FT ADLT (ELECTROSURGICAL) ×2 IMPLANT
GAUZE 4X4 16PLY ~~LOC~~+RFID DBL (SPONGE) ×3 IMPLANT
GAUZE SPONGE 4X4 12PLY STRL (GAUZE/BANDAGES/DRESSINGS) IMPLANT
GLOVE BIO SURGEON STRL SZ 6.5 (GLOVE) ×3 IMPLANT
GLOVE BIOGEL PI IND STRL 7.0 (GLOVE) ×2 IMPLANT
GLOVE BIOGEL PI INDICATOR 7.0 (GLOVE) ×1
GLOVE INDICATOR 6.5 STRL GRN (GLOVE) ×3 IMPLANT
KIT SIGMOIDOSCOPE (SET/KITS/TRAYS/PACK) IMPLANT
KIT TURNOVER CYSTO (KITS) ×3 IMPLANT
NEEDLE HYPO 22GX1.5 SAFETY (NEEDLE) ×3 IMPLANT
NS IRRIG 500ML POUR BTL (IV SOLUTION) ×3 IMPLANT
PACK BASIN DAY SURGERY FS (CUSTOM PROCEDURE TRAY) ×3 IMPLANT
PAD ARMBOARD 7.5X6 YLW CONV (MISCELLANEOUS) IMPLANT
PANTS MESH DISP LRG (UNDERPADS AND DIAPERS) IMPLANT
PANTS MESH DISPOSABLE L (UNDERPADS AND DIAPERS)
PENCIL SMOKE EVACUATOR (MISCELLANEOUS) ×3 IMPLANT
SPONGE HEMORRHOID 8X3CM (HEMOSTASIS) ×1 IMPLANT
SPONGE SURGIFOAM ABS GEL 100 (HEMOSTASIS) IMPLANT
SPONGE SURGIFOAM ABS GEL 12-7 (HEMOSTASIS) IMPLANT
SUT CHROMIC 2 0 SH (SUTURE) ×6 IMPLANT
SUT CHROMIC 3 0 SH 27 (SUTURE) ×6 IMPLANT
SUT VIC AB 2-0 SH 27 (SUTURE)
SUT VIC AB 2-0 SH 27XBRD (SUTURE) IMPLANT
SUT VIC AB 4-0 SH 18 (SUTURE) IMPLANT
SYR CONTROL 10ML LL (SYRINGE) ×3 IMPLANT
TOWEL OR 17X26 10 PK STRL BLUE (TOWEL DISPOSABLE) ×3 IMPLANT
TRAY DSU PREP LF (CUSTOM PROCEDURE TRAY) ×3 IMPLANT
TUBE CONNECTING 12X1/4 (SUCTIONS) ×3 IMPLANT
WATER STERILE IRR 500ML POUR (IV SOLUTION) IMPLANT
YANKAUER SUCT BULB TIP NO VENT (SUCTIONS) ×3 IMPLANT

## 2022-06-07 NOTE — H&P (Signed)
REFERRING PHYSICIAN:  Eleanora Neighbor, MD   PROVIDER:  Elenora Gamma, MD   MRN: [id number] DOB: March 12, 1981 DATE OF ENCOUNTER: 04/16/2022   Subjective    Chief Complaint: Anal problems       History of Present Illness: Jaime Hughes is a 41 y.o. female who is seen today as an office consultation at the request of Dr. Cloretta Ned for evaluation of Anal problems .   Pt has large anal skin tags.  They have been present for many years.  She attributes them to a history of constipation.  She states that that is much better now and she has regular soft bowel movements on a daily basis.  She denies any rectal bleeding.  She has had a colonoscopy within the last few months.  Patient states she commonly has trouble with inflammation and pain especially with wiping.  She feels that she cannot get the area clean.  She denies any straining.     Review of Systems: A complete review of systems was obtained from the patient.  I have reviewed this information and discussed as appropriate with the patient.  See HPI as well for other ROS.     Medical History: Past Medical History         Past Medical History:  Diagnosis Date   Anxiety     Arthritis     GERD (gastroesophageal reflux disease)     Hypertension     Sleep apnea          There is no problem list on file for this patient.     Past Surgical History           Past Surgical History:  Procedure Laterality Date   TONSILLECTOMY        2010   ulner nerve Right      2017 and 2019        Allergies           Allergies  Allergen Reactions   Penicillins Hives and Rash   Codeine Nausea And Vomiting, Swelling and Vomiting      hives hives     Sulfa (Sulfonamide Antibiotics) Other (See Comments), Vomiting and Nausea And Vomiting      rash rash                     Current Outpatient Medications on File Prior to Visit  Medication Sig Dispense Refill   lisinopriL (ZESTRIL) 10 MG tablet Take by mouth        cyanocobalamin (VITAMIN B12) 1000 MCG tablet Take by mouth       norethindrone-ethinyl estradiol-iron (JUNEL FE 1.5/30, 28,) 1.5 mg-30 mcg (21)/75 mg (7) tablet Junel FE 1.5/30 (28) 1.5 mg-30 mcg (21)/75 mg (7) tablet  TAKE 1 TABLET BY MOUTH DAILY       pantoprazole (PROTONIX) 40 MG DR tablet Take 40 mg by mouth once daily       sertraline (ZOLOFT) 100 MG tablet sertraline 100 mg tablet  TAKE 1 TABLET BY MOUTH IN THE EVENING       zonisamide (ZONEGRAN) 100 MG capsule Take 100 mg by mouth once daily        No current facility-administered medications on file prior to visit.      Family History           Family History  Problem Relation Age of Onset   Breast cancer Mother     High blood pressure (Hypertension) Father  Social History         Tobacco Use  Smoking Status Never  Smokeless Tobacco Never      Social History  Social History           Socioeconomic History   Marital status: Married  Tobacco Use   Smoking status: Never   Smokeless tobacco: Never  Vaping Use   Vaping Use: Never used  Substance and Sexual Activity   Alcohol use: Never   Drug use: Never        Objective:           Vitals:    04/16/22 1444  BP: 120/74  Pulse: 102  Temp: 36.6 C (97.9 F)  SpO2: 97%  Weight: 89.4 kg (197 lb)  Height: 157.5 cm (5\' 2" )      Exam Gen: NAD Abd: soft CV: RRR Lungs: CTA Rectal: Multiple skin tags noted at all 3 hemorrhoid positions.     Labs, Imaging and Diagnostic Testing:   Procedure: Anoscopy Surgeon: After the risks and benefits were explained, written consent was obtained for above procedure.  A medical assistant chaperone was present thoroughout the entire procedure.  Anesthesia: none Diagnosis: Hemorrhoids Findings: Multiple skin tags noted externally.  No internal hemorrhoids noted.     Assessment and Plan:  Diagnoses and all orders for this visit:   Skin tag of anus     Patient has external skin tags related to what  appears to be previous hemorrhoidal inflammation.  She is currently controlling her bowel movements and denies any problems with constipation.  Despite this, she continues to have problems with irritation and swelling as well as hygiene.  We discussed proceeding with a hemorrhoidectomy.  We discussed the typical postoperative pain associated with this.  We discussed the typical healing time.  We discussed the risk of recurrence of her skin tags.  We discussed the typical time needed to be off work (approximately 2 weeks).  We discussed the risk of stricture and bleeding.  We discussed the need for multimodality pain control postoperatively.  All questions were answered.  Patient would like to proceed with surgery.  [name], MD  Colorectal and General Surgery [facility]

## 2022-06-07 NOTE — Anesthesia Postprocedure Evaluation (Signed)
Anesthesia Post Note  Patient: Jaime Hughes  Procedure(s) Performed: SINGLE HEMORRHOIDECTOMY, EXCISION OF SKIN TAGS (Rectum)     Patient location during evaluation: PACU Anesthesia Type: MAC Level of consciousness: awake and alert Pain management: pain level controlled Vital Signs Assessment: post-procedure vital signs reviewed and stable Respiratory status: spontaneous breathing, nonlabored ventilation and respiratory function stable Cardiovascular status: blood pressure returned to baseline Postop Assessment: no apparent nausea or vomiting Anesthetic complications: no   No notable events documented.  Last Vitals:  Vitals:   06/07/22 0830 06/07/22 0845  BP: 120/76 126/79  Pulse: 100 97  Resp: 16 18  Temp:    SpO2: 95% 94%    Last Pain:  Vitals:   06/07/22 0815  TempSrc:   PainSc: 0-No pain                 Marthenia Rolling

## 2022-06-07 NOTE — Discharge Instructions (Addendum)
ANORECTAL SURGERY: POST OP INSTRUCTIONS Take your usually prescribed home medications unless otherwise directed. DIET: During the first few hours after surgery sip on some liquids until you are able to urinate.  It is normal to not urinate for several hours after this surgery.  If you feel uncomfortable, please contact the office for instructions.  After you are able to urinate,you may eat, if you feel like it.  Follow a light bland diet the first 24 hours after arrival home, such as soup, liquids, crackers, etc.  Be sure to include lots of fluids daily (6-8 glasses).  Avoid fast food or heavy meals, as your are more likely to get nauseated.  Eat a low fat diet the next few days after surgery.  Limit caffeine intake to 1-2 servings a day. PAIN CONTROL: Pain is best controlled by a usual combination of several different methods TOGETHER: Muscle relaxation: Soak in a warm bath (or Sitz bath) three times a day and after bowel movements.  Continue to do this until all pain is resolved.  You may also need to do this if you have trouble urinating for the first few days after surgery. Over the counter pain medication Prescription pain medication Most patients will experience some swelling and discomfort in the anus/rectal area and incisions.  Heat such as warm towels, sitz baths, warm baths, etc to help relax tight/sore spots and speed recovery.  Some people prefer to use ice, especially in the first couple days after surgery, as it may decrease the pain and swelling, or alternate between ice & heat.  Experiment to what works for you.  Swelling and bruising can take several weeks to resolve.  Pain can take even longer to completely resolve. It is helpful to take an over-the-counter pain medication regularly for the first few weeks.  Choose one of the following that works best for you: Naproxen (Aleve, etc)  Two 220mg  tabs twice a day Ibuprofen (Advil, etc) Three 200mg  tabs four times a day (every meal &  bedtime) A  prescription for pain medication (such as percocet, oxycodone, hydrocodone, etc) should be given to you upon discharge.  Take your pain medication as prescribed.  If you are having problems/concerns with the prescription medicine (does not control pain, nausea, vomiting, rash, itching, etc), please call [phone] to see if we need to switch you to a different pain medicine that will work better for you and/or control your side effect better. If you need a refill on your pain medication, please contact your pharmacy.  They will contact our office to request authorization. Prescriptions will not be filled after 5 pm or on week-ends. KEEP YOUR BOWELS REGULAR and AVOID CONSTIPATION The goal is one to two soft bowel movements a day.  You should at least have a bowel movement every other day. Avoid getting constipated.  Between the surgery and the pain medications, it is common to experience some constipation. This can be very painful after rectal surgery.  Increasing fluid intake and taking a fiber supplement (such as Metamucil, Citrucel, FiberCon, etc) 1-2 times a day regularly will usually help prevent this problem from occurring.  A stool softener like colace is also recommended.  This can be purchased over the counter at your pharmacy.  You can take it up to 3 times a day.  If you do not have a bowel movement after 24 hrs since your surgery, take one does of milk of magnesia.  If you still haven't had a bowel movement  8-12 hours after that dose, take another dose.  If you don't have a bowel movement 48 hrs after surgery, purchase a Fleets enema from the drug store and administer gently per package instructions.  If you still are having trouble with your bowel movements after that, please call the office for further instructions. If you develop diarrhea or have many loose bowel movements, simplify your diet to bland foods & liquids for a few days.  Stop any stool softeners and decrease your  fiber supplement.  Switching to mild anti-diarrheal medications (Kayopectate, Pepto Bismol) can help.  If this worsens or does not improve, please call us.  Wound Care Remove your bandages before your first bowel movement or 8 hours after surgery.     Remove any wound packing material at this tim,e as well.  You do not need to repack the wound unless instructed otherwise.  Wear an absorbent pad or soft cotton gauze in your underwear to catch any drainage and help keep the area clean. You should change this every 2-3 hours while awake. Keep the area clean and dry.  Bathe / shower every day, especially after bowel movements.  Keep the area clean by showering / bathing over the incision / wound.   It is okay to soak an open wound to help wash it.  Wet wipes or showers / gentle washing after bowel movements is often less traumatic than regular toilet paper. You may have some styrofoam-like soft packing in the rectum which will come out with the first bowel movement.  You will often notice bleeding with bowel movements.  This should slow down by the end of the first week of surgery Expect some drainage.  This should slow down, too, by the end of the first week of surgery.  Wear an absorbent pad or soft cotton gauze in your underwear until the drainage stops. Do Not sit on a rubber or pillow ring.  This can make you symptoms worse.  You may sit on a soft pillow if needed.  ACTIVITIES as tolerated:   You may resume regular (light) daily activities beginning the next day--such as daily self-care, walking, climbing stairs--gradually increasing activities as tolerated.  If you can walk 30 minutes without difficulty, it is safe to try more intense activity such as jogging, treadmill, bicycling, low-impact aerobics, swimming, etc. Save the most intensive and strenuous activity for last such as sit-ups, heavy lifting, contact sports, etc  Refrain from any heavy lifting or straining until you are off narcotics for  pain control.   You may drive when you are no longer taking prescription pain medication, you can comfortably sit for long periods of time, and you can safely maneuver your car and apply brakes. You may have sexual intercourse when it is comfortable.  FOLLOW UP in our office Please call [facility] at [phone] to set up an appointment to see your surgeon in the office for a follow-up appointment approximately 3-4 weeks after your surgery. Make sure that you call for this appointment the day you arrive home to insure a convenient appointment time. 10. IF YOU HAVE DISABILITY OR FAMILY LEAVE FORMS, BRING THEM TO THE OFFICE FOR PROCESSING.  DO NOT GIVE THEM TO YOUR DOCTOR.     WHEN TO CALL us [phone]: Poor pain control Reactions / problems with new medications (rash/itching, nausea, etc)  Fever over 101.5 F (38.5 C) Inability to urinate Nausea and/or vomiting Worsening swelling or bruising Continued bleeding from incision. Increased pain, redness, or  drainage from the incision  The clinic staff is available to answer your questions during regular business hours (8:30am-5pm).  Please don't hesitate to call and ask to speak to one of our nurses for clinical concerns.   A surgeon from [facility] is always on call at the hospitals   If you have a medical emergency, go to the nearest emergency room or call 911.    [facility] [address], [place], [place]  [address] ? MAIN: [phone] ? TOLL FREE: [phone] ? FAX [phone] [email or web address]    Post Anesthesia Home Care Instructions  Activity: Get plenty of rest for the remainder of the day. A responsible individual must stay with you for 24 hours following the procedure.  For the next 24 hours, DO NOT: -Drive a car -Advertising copywriter -Drink alcoholic beverages -Take any medication unless instructed by your physician -Make any legal decisions or sign  important papers.  Meals: Start with liquid foods such as gelatin or soup. Progress to regular foods as tolerated. Avoid greasy, spicy, heavy foods. If nausea and/or vomiting occur, drink only clear liquids until the nausea and/or vomiting subsides. Call your physician if vomiting continues.  Special Instructions/Symptoms: Your throat may feel dry or sore from the anesthesia or the breathing tube placed in your throat during surgery. If this causes discomfort, gargle with warm salt water. The discomfort should disappear within 24 hours.  No acetaminophen/Tylenol until after 12 pm (noon) today if needed.  Information for Discharge Teaching: EXPAREL (bupivacaine liposome injectable suspension)   Your surgeon or anesthesiologist gave you EXPAREL(bupivacaine) to help control your pain after surgery.  EXPAREL is a local anesthetic that provides pain relief by numbing the tissue around the surgical site. EXPAREL is designed to release pain medication over time and can control pain for up to 72 hours. Depending on how you respond to EXPAREL, you may require less pain medication during your recovery.  Possible side effects: Temporary loss of sensation or ability to move in the area where bupivacaine was injected. Nausea, vomiting, constipation Rarely, numbness and tingling in your mouth or lips, lightheadedness, or anxiety may occur. Call your doctor right away if you think you may be experiencing any of these sensations, or if you have other questions regarding possible side effects.  Follow all other discharge instructions given to you by your surgeon or nurse. Eat a healthy diet and drink plenty of water or other fluids.  If you return to the hospital for any reason within 96 hours following the administration of EXPAREL, it is important for health care providers to know that you have received this anesthetic. A teal colored band has been placed on your arm with the date, time and amount of  EXPAREL you have received in order to alert and inform your health care providers. Please leave this armband in place for the full 96 hours following administration, and then you may remove the band. (May remove band Monday 6/19)

## 2022-06-07 NOTE — Op Note (Signed)
06/07/2022  8:06 AM  PATIENT:  Jaime Hughes  41 y.o. female  Patient Care Team: Dulce Sellar, MD as PCP - General ([place]) Janyth Contes, MD as Consulting Physician (Obstetrics and Gynecology) Rogene Houston, MD as Consulting Physician (Gastroenterology)  PRE-OPERATIVE DIAGNOSIS:  HEMORRHOID and ANAL SKIN TAGS  POST-OPERATIVE DIAGNOSIS:  HEMORRHOID and ANAL SKIN TAGS  PROCEDURE:  SINGLE COLUMN HEMORRHOIDECTOMY, EXCISION OF SKIN TAGS    Surgeon(s): Leighton Ruff, MD  ASSISTANT: none   ANESTHESIA:   local, MAC  SPECIMEN:  Source of Specimen:  R anterior hemorrhoid  DISPOSITION OF SPECIMEN:  PATHOLOGY  COUNTS:  YES  PLAN OF CARE: Discharge to home after PACU  PATIENT DISPOSITION:  PACU - hemodynamically stable.  INDICATION: 41 y.o. F with enlarged anal skin tags   OR FINDINGS: Grade 2 R anterior heorrhoid  DESCRIPTION: the patient was identified in the preoperative holding area and taken to the OR where they were laid on the operating room table.  MAC anesthesia was induced without difficulty. The patient was then positioned in prone jackknife position with buttocks gently taped apart.  The patient was then prepped and draped in usual sterile fashion.  SCDs were noted to be in place prior to the initiation of anesthesia. A surgical timeout was performed indicating the correct patient, procedure, positioning and need for preoperative antibiotics.  A rectal block was performed using Marcaine with epinephrine mixed with Experel.    I began with a digital rectal exam.  There were no masses noted within the anal canal.  Sphincter tone was good.  I then placed a Hill-Ferguson anoscope into the anal canal and evaluated this completely.  The patient had a grade 2 right anterior hemorrhoid.  This was associated with elongated skin tags externally.  I began by excising this hemorrhoidal column using a 10 blade scalpel.  Dissection was carried down to the level  of the sphincter complex.  The internal and external sphincter were identified and preserved.  I removed the remaining hemorrhoidal tissue and sent it to pathology for further examination.  I then closed the internal anoderm using a running 2-0 chromic suture.  Externally I used a 3-0 chromic suture in a running fashion.  Hemostasis was good.  There were some small skin tags noted externally which were excised and closed with interrupted 3-0 chromic sutures.  Additional Marcaine was placed around the incision sites.  A Gelfoam sponge was placed inside the anal canal for postoperative hemostasis.  Fluffs and a sterile dressing were applied.  The patient was then awakened from anesthesia and sent to the postanesthesia care unit in stable condition.  All counts were correct per operating room staff.  Rosario Adie, MD  [facility]

## 2022-06-07 NOTE — Transfer of Care (Signed)
Immediate Anesthesia Transfer of Care Note  Patient: Jaime Hughes  Procedure(s) Performed: SINGLE HEMORRHOIDECTOMY, EXCISION OF SKIN TAGS (Rectum)  Patient Location: PACU  Anesthesia Type:MAC  Level of Consciousness: awake, alert , oriented and patient cooperative  Airway & Oxygen Therapy: Patient Spontanous Breathing  Post-op Assessment: Report given to RN and Post -op Vital signs reviewed and stable  Post vital signs: Reviewed and stable  Last Vitals:  Vitals Value Taken Time  BP 122/70 06/07/22 0815  Temp 36.6 C 06/07/22 0815  Pulse 107 06/07/22 0818  Resp 20 06/07/22 0818  SpO2 97 % 06/07/22 0818  Vitals shown include unvalidated device data.  Last Pain:  Vitals:   06/07/22 0532  TempSrc: Oral  PainSc: 0-No pain         Complications: No notable events documented.

## 2022-06-08 ENCOUNTER — Encounter (HOSPITAL_BASED_OUTPATIENT_CLINIC_OR_DEPARTMENT_OTHER): Payer: Self-pay | Admitting: General Surgery

## 2022-06-08 LAB — SURGICAL PATHOLOGY

## 2022-09-28 ENCOUNTER — Ambulatory Visit: Payer: BC Managed Care – PPO | Admitting: Allergy & Immunology

## 2022-09-28 ENCOUNTER — Encounter: Payer: Self-pay | Admitting: Allergy & Immunology

## 2022-09-28 VITALS — BP 124/82 | HR 85 | Temp 98.6°F | Resp 16 | Ht 63.39 in | Wt 198.2 lb

## 2022-09-28 DIAGNOSIS — K9049 Malabsorption due to intolerance, not elsewhere classified: Secondary | ICD-10-CM | POA: Diagnosis not present

## 2022-09-28 DIAGNOSIS — L500 Allergic urticaria: Secondary | ICD-10-CM | POA: Diagnosis not present

## 2022-09-28 MED ORDER — FAMOTIDINE 20 MG PO TABS: 20.0000 mg | ORAL_TABLET | Freq: Two times a day (BID) | ORAL | 3 refills | Status: AC

## 2022-09-28 NOTE — Patient Instructions (Addendum)
1. Allergic urticaria with concern for food allergies - Testing was negative to the entire panel, but you skin was just overall very sensitive. - Copy of testing results provided. - There is a the low positive predictive value of food allergy testing and hence the high possibility of false positives. - In contrast, food allergy testing has a high negative predictive value, therefore if testing is negative we can be relatively assured that they are indeed negative.  - We are going to get some labs, including for a gluten sensitivity, to see what else might be contributing to your symptoms. - In he meantime, I would recommend doing suppressive antihistamines such as Allegra + Pepcid twice daily until we figure out what is going on. - We will call you in 1-2 weeks with the results of her testing.   2. Return in about 6 weeks (around 11/09/2022).    Please inform us of any Emergency Department visits, hospitalizations, or changes in symptoms. Call us before going to the ED for breathing or allergy symptoms since we might be able to fit you in for a sick visit. Feel free to contact us anytime with any questions, problems, or concerns.  It was a pleasure to see you again today!  Websites that have reliable patient information: 1. American Academy of Asthma, Allergy, and Immunology: www.aaaai.org 2. Food Allergy Research and Education (FARE): foodallergy.org 3. Mothers of Asthmatics: http://www.asthmacommunitynetwork.org 4. American College of Allergy, Asthma, and Immunology: www.acaai.org   COVID-19 Vaccine Information can be found at: [email or web address] For questions related to vaccine distribution or appointments, please email [email or web address] or call [phone].   We realize that you might be concerned about having an allergic reaction to the COVID19 vaccines. To help with that concern, WE ARE OFFERING THE COVID19 VACCINES IN  OUR OFFICE! Ask the front desk for dates!     "Like" [place] on Facebook and Instagram for our latest updates!      A healthy democracy works best when [employer] participate! Make sure you are registered to vote! If you have moved or changed any of your contact information, you will need to get this updated before voting!  In some cases, you MAY be able to register to vote online: [email or web address]        Food Adult Perc - 09/28/22 0900     Time Antigen Placed 0932    Allergen Manufacturer Waynette Buttery    Location Back    Number of allergen test 72     Control-buffer 50% Glycerol Negative    Control-Histamine 1 mg/ml 3+    1. Peanut Negative    2. Soybean Negative    3. Wheat Negative    4. Sesame Negative    5. Milk, cow Negative    6. Egg White, Chicken Negative    7. Casein Negative    8. Shellfish Mix Negative    9. Fish Mix Negative    10. Cashew Negative    11. Pecan Food Negative    12. Walnut Food Negative    13. Almond Negative    14. Hazelnut Negative    15. [place] nut Negative    16. Coconut Negative    17. Pistachio Negative    18. Catfish Negative    19. Bass Negative    20. Trout Negative    21. Tuna Negative    22. Salmon Negative    23. Flounder Negative    24. Codfish Negative    25. Shrimp Negative  26. Crab Negative    27. Lobster Negative    28. Oyster Negative    29. Scallops Negative    30. Barley Negative    31. Oat  Negative    32. Rye  Negative    33. Hops Negative    34. Rice Negative    35. Cottonseed Negative    36. Saccharomyces Cerevisiae  Negative    37. Pork Negative    38. [place] Meat Negative    39. Chicken Meat Negative    40. Beef Negative    41. Lamb Negative    42. Tomato Negative    43. White Potato Negative    44. Sweet Potato Negative    45. Pea, Green/English Negative    46. Navy Bean Negative    47. Mushrooms Negative    48. Avocado Negative    49. Onion Negative    50. Cabbage  Negative    51. Carrots Negative    52. Celery Negative    53. Corn Negative    54. Cucumber Negative    55. Grape (White seedless) Negative    56. Orange  Negative    57. Banana Negative    58. Apple Negative    59. Peach Negative    60. Strawberry Negative    61. Cantaloupe Negative    62. Watermelon Negative    63. Pineapple Negative    64. Chocolate/Cacao bean Negative    65. Karaya Gum Negative    66. Acacia (Arabic Gum) Negative    67. Cinnamon Negative    68. Nutmeg Negative    69. Ginger Negative    70. Garlic Negative    71. Pepper, black Negative    72. Mustard Negative

## 2022-09-28 NOTE — Progress Notes (Signed)
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 false positive)  Plan/Recommendations:    1. Allergic urticaria with concern for food allergies - Testing was negative to the entire panel, but you skin was just overall very sensitive. - Copy of testing results provided. - There is a the low positive predictive value of food allergy testing and hence the high possibility of false positives. - In contrast, food allergy testing has a high negative predictive value, therefore if testing is negative we can be relatively assured that they are indeed negative.  - We are going to get some labs, including for a gluten sensitivity, to see what else might be contributing to your symptoms. - In he meantime, I would recommend doing suppressive antihistamines such as Allegra + Pepcid twice daily until we figure out what is going on. - We will call you in 1-2 weeks with the results of her testing.   2. Return in about 6 weeks (around 11/09/2022).   Subjective:   Jaime Hughes is a 41 y.o. female presenting today for follow up of  Chief Complaint  Patient presents with   Allergy Testing    Food and environmental testing.     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  Jaime Hughes is a 41 y.o. female presenting for a follow up visit.  She was last seen in June 2021.  At that time, she had testing that was positive to trees and molds.  She had testing that was negative to everything we tested for. Almond was slightly reactive.  She has a 59yo daughter in middle school.   Food Allergy Symptom History: She is avoiding all tree nuts as well as sesame. She does this as a precaution. She does not eat a lot of peanuts, but she has not avoided them.  Lately she feels that everything is tearing her stomach up and she feels that her face breaks up. She thinks that this might be a gluten like thing. She also continues to avoid shellfish (tolerates fin fish without a problem). She does have itching from certain foods and actually she is itching daily. She does feel that her skin gets "welty" occasionally. She is not sure whether this is foods or something else entirely. We have done an alpha gal panel and this has been negative.   GERD Symptom History: She does see a GI doctor through [facility].   She had a colonoscopy and endoscopy done in February 2023 that was normal. She had a polyp removed that was precancerous. She did have gastritis. She is on pantoprazole anyway. She has been good for a while but it has worsened. She has soft stools 5-6 times a day at some point with some IBS like symptoms. She thinks that breads seem to make  everything worse. Thyroid studies have been normal.   She usually uses ground [place] for her meals. She doesn ot use a lot of ground beef. She uses chicken and occasionally eats a steak.   Her PCP also is at [facility]. She was previously following with Dr. Karie Kirks who kicked them out because they did not get the COVID vaccination. Now she drives to [facility] in [place], [place]. She has not seen Dermatology, but she is going to get that scheduled.   Otherwise, there have been no changes to her past medical  history, surgical history, family history, or social history.    Review of Systems  Constitutional: Negative.  Negative for chills, fever, malaise/fatigue and weight loss.  HENT:  Negative for congestion, ear discharge, ear pain and sinus pain.   Eyes:  Negative for pain, discharge and redness.  Respiratory:  Negative for cough, sputum production, shortness of breath and wheezing.   Cardiovascular: Negative.  Negative for chest pain and palpitations.  Gastrointestinal:  Positive for heartburn. Negative for abdominal pain, constipation, diarrhea, nausea and vomiting.  Skin:  Positive for itching and rash.  Neurological:  Negative for dizziness and headaches.  Endo/Heme/Allergies:  Positive for environmental allergies. Does not bruise/bleed easily.       Objective:   Blood pressure 124/82, pulse 85, temperature 98.6 F (37 C), temperature source Temporal, resp. rate 16, height 5' 3.39" (1.61 m), weight 198 lb 3.2 oz (89.9 kg), SpO2 96 %. Body mass index is 34.68 kg/m.    Physical Exam Vitals reviewed.  Constitutional:      Appearance: She is well-developed.  HENT:     Head: Normocephalic and atraumatic.     Right Ear: Tympanic membrane, ear canal and external ear normal.     Left Ear: Tympanic membrane, ear canal and external ear normal.     Nose: No nasal deformity, septal deviation, mucosal edema or rhinorrhea.     Right Turbinates: Not enlarged or swollen.     Left Turbinates: Not enlarged or swollen.     Right Sinus: No maxillary sinus tenderness or frontal sinus tenderness.     Left Sinus: No maxillary sinus tenderness or frontal sinus tenderness.     Mouth/Throat:     Mouth: Mucous membranes are not pale and not dry.     Pharynx: Uvula midline.  Eyes:     General: Lids are normal. No allergic shiner.       Right eye: No discharge.        Left eye: No discharge.     Conjunctiva/sclera: Conjunctivae normal.     Right eye: Right conjunctiva is not injected. No  chemosis.    Left eye: Left conjunctiva is not injected. No chemosis.    Pupils: Pupils are equal, round, and reactive to light.  Cardiovascular:     Rate and Rhythm: Normal rate and regular rhythm.     Heart sounds: Normal heart sounds.  Pulmonary:     Effort: Pulmonary effort is normal. No tachypnea, accessory muscle usage or respiratory distress.     Breath sounds: Normal breath sounds. No wheezing, rhonchi or rales.  Chest:     Chest wall: No tenderness.  Lymphadenopathy:     Cervical: No cervical adenopathy.  Skin:    General: Skin is warm.     Capillary Refill: Capillary refill takes less than 2 seconds.     Coloration: Skin is not pale.     Findings: Rash present. No abrasion, erythema or  petechiae. Rash is not papular, urticarial or vesicular.     Comments: She has what appears to be almost a rosacea like rash on her face. She has very sensitive skin overall.  Neurological:     Mental Status: She is alert.  Psychiatric:        Behavior: Behavior is cooperative.      Diagnostic studies:   Allergy Studies:     Food Adult Perc - 09/28/22 0900     Time Antigen Placed 0932    Allergen Manufacturer Lavella Hammock    Location Back    Number of allergen test 72     Control-buffer 50% Glycerol Negative    Control-Histamine 1 mg/ml 3+    1. Peanut Negative    2. Soybean Negative    3. Wheat Negative    4. Sesame Negative    5. Milk, cow Negative    6. Egg White, Chicken Negative    7. Casein Negative    8. Shellfish Mix Negative    9. Fish Mix Negative    10. Cashew Negative    11. Pecan Food Negative    12. [place] Negative    13. Almond Negative    14. Hazelnut Negative    15. [place] nut Negative    16. Coconut Negative    17. Pistachio Negative    18. Catfish Negative    19. Bass Negative    20. Trout Negative    21. Tuna Negative    22. Salmon Negative    23. Flounder Negative    24. Codfish Negative    25. Shrimp Negative    26. Crab Negative    27.  Lobster Negative    28. Oyster Negative    29. Scallops Negative    30. Barley Negative    31. Oat  Negative    32. Rye  Negative    33. Hops Negative    34. Rice Negative    35. Cottonseed Negative    36. Saccharomyces Cerevisiae  Negative    37. Pork Negative    38. [place] Meat Negative    39. Chicken Meat Negative    40. Beef Negative    41. Lamb Negative    42. Tomato Negative    43. White Potato Negative    44. Sweet Potato Negative    45. Pea, Green/English Negative    46. Navy Bean Negative    47. Mushrooms Negative    48. Avocado Negative    49. Onion Negative    50. Cabbage Negative    51. Carrots Negative    52. Celery Negative    53. Corn Negative    54. Cucumber Negative    55. Grape (White seedless) Negative    56. Orange  Negative    57. Banana Negative    58. Apple Negative    59. Peach Negative    60. Strawberry Negative    61. Cantaloupe Negative    62. Watermelon Negative    63. Pineapple Negative    64. Chocolate/Cacao bean Negative    65. Karaya Gum Negative    66. Acacia (Arabic Gum) Negative    67. Cinnamon Negative    68. Nutmeg Negative    69. Ginger Negative    70. Garlic Negative    71. Pepper, black Negative    72. Mustard Negative             Allergy testing results were read and interpreted by myself,  documented by clinical staff.      Salvatore Marvel, MD  [facility]

## 2022-10-01 LAB — ALPHA-GAL PANEL
Allergen Lamb IgE: <0.1 kU/L
Beef IgE: <0.1 kU/L
IgE (Immunoglobulin E), Serum: 46 IU/mL (ref 6–495)
O215-IgE Alpha-Gal: <0.1 kU/L
Pork IgE: <0.1 kU/L

## 2022-10-10 LAB — CBC WITH DIFFERENTIAL
Basophils Absolute: 0.1 10*3/uL (ref 0.0–0.2)
Basos: 1 %
EOS (ABSOLUTE): 0 10*3/uL (ref 0.0–0.4)
Eos: 0 %
Hematocrit: 41.5 % (ref 34.0–46.6)
Hemoglobin: 13.7 g/dL (ref 11.1–15.9)
Immature Grans (Abs): 0 10*3/uL (ref 0.0–0.1)
Immature Granulocytes: 0 %
Lymphocytes Absolute: 1.9 10*3/uL (ref 0.7–3.1)
Lymphs: 23 %
MCH: 27.9 pg (ref 26.6–33.0)
MCHC: 33 g/dL (ref 31.5–35.7)
MCV: 85 fL (ref 79–97)
Monocytes Absolute: 0.4 10*3/uL (ref 0.1–0.9)
Monocytes: 5 %
Neutrophils Absolute: 5.8 10*3/uL (ref 1.4–7.0)
Neutrophils: 71 %
RBC: 4.91 x10E6/uL (ref 3.77–5.28)
RDW: 14 % (ref 11.7–15.4)
WBC: 8.2 10*3/uL (ref 3.4–10.8)

## 2022-10-10 LAB — CMP14+EGFR
ALT: 12 IU/L (ref 0–32)
AST: 14 IU/L (ref 0–40)
Albumin/Globulin Ratio: 1.9 (ref 1.2–2.2)
Albumin: 4.6 g/dL (ref 3.9–4.9)
Alkaline Phosphatase: 52 IU/L (ref 44–121)
BUN/Creatinine Ratio: 12 (ref 9–23)
BUN: 8 mg/dL (ref 6–24)
Bilirubin Total: 0.4 mg/dL (ref 0.0–1.2)
CO2: 22 mmol/L (ref 20–29)
Calcium: 9.5 mg/dL (ref 8.7–10.2)
Chloride: 102 mmol/L (ref 96–106)
Creatinine, Ser: 0.66 mg/dL (ref 0.57–1.00)
Globulin, Total: 2.4 g/dL (ref 1.5–4.5)
Glucose: 74 mg/dL (ref 70–99)
Potassium: 3.8 mmol/L (ref 3.5–5.2)
Sodium: 137 mmol/L (ref 134–144)
Total Protein: 7 g/dL (ref 6.0–8.5)
eGFR: 112 mL/min/{1.73_m2} (ref >59)

## 2022-10-10 LAB — THYROID ANTIBODIES
Thyroglobulin Antibody: <1 IU/mL (ref 0.0–0.9)
Thyroperoxidase Ab SerPl-aCnc: 17 IU/mL (ref 0–34)

## 2022-10-10 LAB — TRYPTASE: Tryptase: 4.2 ug/L (ref 2.2–13.2)

## 2022-10-10 LAB — CELIAC DISEASE AB SCREEN W/RFX
Antigliadin Abs, IgA: 3 units (ref 0–19)
IgA/Immunoglobulin A, Serum: 165 mg/dL (ref 87–352)
Transglutaminase IgA: <2 U/mL (ref 0–3)

## 2022-10-10 LAB — ANTINUCLEAR ANTIBODIES, IFA: ANA Titer 1: NEGATIVE

## 2022-10-10 LAB — CHRONIC URTICARIA: cu index: <3.1 (ref <10)

## 2022-10-10 LAB — C-REACTIVE PROTEIN: CRP: 6 mg/L (ref 0–10)

## 2022-10-10 LAB — SEDIMENTATION RATE: Sed Rate: 5 mm/hr (ref 0–32)

## 2022-11-12 ENCOUNTER — Encounter (INDEPENDENT_AMBULATORY_CARE_PROVIDER_SITE_OTHER): Payer: Self-pay | Admitting: Gastroenterology

## 2022-11-21 ENCOUNTER — Ambulatory Visit: Payer: BC Managed Care – PPO | Admitting: Allergy & Immunology

## 2023-01-22 ENCOUNTER — Other Ambulatory Visit: Payer: Self-pay | Admitting: Allergy & Immunology

## 2023-10-07 ENCOUNTER — Other Ambulatory Visit: Payer: Self-pay | Admitting: Obstetrics and Gynecology

## 2023-10-07 DIAGNOSIS — R928 Other abnormal and inconclusive findings on diagnostic imaging of breast: Secondary | ICD-10-CM

## 2023-10-15 ENCOUNTER — Inpatient Hospital Stay
Admission: RE | Admit: 2023-10-15 | Discharge: 2023-10-15 | Discharge status: Home / Self Care | Payer: Commercial Managed Care - PPO | Source: Ambulatory Visit | Attending: Obstetrics and Gynecology | Admitting: Obstetrics and Gynecology

## 2023-10-15 ENCOUNTER — Ambulatory Visit: Payer: BC Managed Care – PPO

## 2023-10-15 DIAGNOSIS — R928 Other abnormal and inconclusive findings on diagnostic imaging of breast: Secondary | ICD-10-CM

## 2023-10-17 ENCOUNTER — Other Ambulatory Visit: Payer: BC Managed Care – PPO

## 2024-09-28 ENCOUNTER — Other Ambulatory Visit: Payer: Self-pay | Admitting: Obstetrics and Gynecology

## 2024-09-28 DIAGNOSIS — Z1231 Encounter for screening mammogram for malignant neoplasm of breast: Secondary | ICD-10-CM

## 2024-10-07 ENCOUNTER — Encounter (INDEPENDENT_AMBULATORY_CARE_PROVIDER_SITE_OTHER): Payer: Self-pay | Admitting: Gastroenterology

## 2024-10-09 ENCOUNTER — Ambulatory Visit
Admission: RE | Admit: 2024-10-09 | Discharge: 2024-10-09 | Disposition: A | Discharge status: Home / Self Care | Source: Ambulatory Visit | Attending: Obstetrics and Gynecology | Admitting: Obstetrics and Gynecology

## 2024-10-09 DIAGNOSIS — Z1231 Encounter for screening mammogram for malignant neoplasm of breast: Secondary | ICD-10-CM
